# Patient Record
Sex: Female | Born: 1955 | Race: White | Hispanic: No | Marital: Married | Smoking: Never smoker
Health system: Southern US, Community
[De-identification: ages and names within clinical notes are randomized; demographics above are authoritative.]

## PROBLEM LIST (undated history)

## (undated) DIAGNOSIS — K529 Noninfective gastroenteritis and colitis, unspecified: Secondary | ICD-10-CM

## (undated) DIAGNOSIS — J0100 Acute maxillary sinusitis, unspecified: Secondary | ICD-10-CM

## (undated) DIAGNOSIS — K219 Gastro-esophageal reflux disease without esophagitis: Secondary | ICD-10-CM

## (undated) DIAGNOSIS — K648 Other hemorrhoids: Secondary | ICD-10-CM

## (undated) DIAGNOSIS — G4733 Obstructive sleep apnea (adult) (pediatric): Secondary | ICD-10-CM

## (undated) DIAGNOSIS — K5900 Constipation, unspecified: Secondary | ICD-10-CM

## (undated) DIAGNOSIS — K5909 Other constipation: Secondary | ICD-10-CM

## (undated) DIAGNOSIS — K649 Unspecified hemorrhoids: Secondary | ICD-10-CM

## (undated) HISTORY — PX: TONSILLECTOMY: SUR1361

## (undated) HISTORY — DX: Constipation, unspecified: K59.00

## (undated) HISTORY — DX: Other constipation: K59.09

## (undated) HISTORY — DX: Acute maxillary sinusitis, unspecified: J01.00

## (undated) HISTORY — DX: Unspecified hemorrhoids: K64.9

## (undated) HISTORY — DX: Obstructive sleep apnea (adult) (pediatric): G47.33

## (undated) HISTORY — DX: Noninfective gastroenteritis and colitis, unspecified: K52.9

## (undated) HISTORY — DX: Other hemorrhoids: K64.8

---
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
## 1984-07-22 HISTORY — PX: OVARIAN CYST REMOVAL: SHX89

## 1991-07-23 HISTORY — PX: ABDOMINAL HYSTERECTOMY: SHX81

## 1991-07-23 HISTORY — PX: APPENDECTOMY: SHX54

## 2008-12-29 ENCOUNTER — Ambulatory Visit: Payer: Self-pay | Admitting: Family Medicine

## 2008-12-29 DIAGNOSIS — K5900 Constipation, unspecified: Secondary | ICD-10-CM | POA: Insufficient documentation

## 2008-12-29 DIAGNOSIS — M461 Sacroiliitis, not elsewhere classified: Secondary | ICD-10-CM | POA: Insufficient documentation

## 2009-01-20 DIAGNOSIS — K921 Melena: Secondary | ICD-10-CM | POA: Insufficient documentation

## 2009-01-20 DIAGNOSIS — K5900 Constipation, unspecified: Secondary | ICD-10-CM

## 2009-01-20 HISTORY — DX: Constipation, unspecified: K59.00

## 2009-02-20 ENCOUNTER — Ambulatory Visit: Payer: Self-pay | Admitting: Gastroenterology

## 2009-03-14 DIAGNOSIS — K648 Other hemorrhoids: Secondary | ICD-10-CM

## 2009-03-14 HISTORY — DX: Other hemorrhoids: K64.8

## 2009-03-28 ENCOUNTER — Ambulatory Visit: Payer: Self-pay | Admitting: Surgery

## 2009-03-31 ENCOUNTER — Ambulatory Visit: Payer: Self-pay | Admitting: Surgery

## 2009-03-31 HISTORY — PX: HEMORROIDECTOMY: SUR656

## 2009-04-20 DIAGNOSIS — H8309 Labyrinthitis, unspecified ear: Secondary | ICD-10-CM | POA: Insufficient documentation

## 2009-09-06 ENCOUNTER — Ambulatory Visit: Payer: Self-pay | Admitting: Family Medicine

## 2010-04-10 ENCOUNTER — Ambulatory Visit: Payer: Self-pay | Admitting: Surgery

## 2012-08-24 ENCOUNTER — Ambulatory Visit: Payer: Self-pay | Admitting: Family Medicine

## 2013-11-15 LAB — BASIC METABOLIC PANEL
BUN: 12 mg/dL (ref 4–21)
Creatinine: 0.7 mg/dL (ref 0.5–1.1)
GLUCOSE: 90 mg/dL
Potassium: 4.6 mmol/L (ref 3.4–5.3)
Sodium: 143 mmol/L (ref 137–147)

## 2013-11-15 LAB — TSH: TSH: 2.49 u[IU]/mL (ref 0.41–5.90)

## 2013-11-15 LAB — HEPATIC FUNCTION PANEL
ALT: 11 U/L (ref 7–35)
AST: 14 U/L (ref 13–35)
Alkaline Phosphatase: 92 U/L (ref 25–125)
Bilirubin, Total: 0.5 mg/dL

## 2013-11-15 LAB — CBC AND DIFFERENTIAL
HEMATOCRIT: 43 % (ref 36–46)
HEMOGLOBIN: 14.5 g/dL (ref 12.0–16.0)
NEUTROS ABS: 6 /uL
PLATELETS: 245 10*3/uL (ref 150–399)
WBC: 10.7 10*3/mL

## 2013-11-15 LAB — LIPID PANEL
CHOLESTEROL: 220 mg/dL — AB (ref 0–200)
HDL: 74 mg/dL — AB (ref 35–70)
LDL CALC: 129 mg/dL
Triglycerides: 85 mg/dL (ref 40–160)

## 2014-08-23 ENCOUNTER — Ambulatory Visit: Payer: Self-pay | Admitting: Family Medicine

## 2015-03-03 ENCOUNTER — Encounter: Payer: Self-pay | Admitting: *Deleted

## 2015-03-06 ENCOUNTER — Ambulatory Visit: Payer: BLUE CROSS/BLUE SHIELD | Admitting: Anesthesiology

## 2015-03-06 ENCOUNTER — Ambulatory Visit
Admission: RE | Admit: 2015-03-06 | Discharge: 2015-03-06 | Disposition: A | Discharge status: Home / Self Care | Payer: BLUE CROSS/BLUE SHIELD | Source: Ambulatory Visit | Attending: Gastroenterology | Admitting: Gastroenterology

## 2015-03-06 ENCOUNTER — Encounter: Payer: Self-pay | Admitting: *Deleted

## 2015-03-06 ENCOUNTER — Encounter
Admission: RE | Disposition: A | Discharge status: Home / Self Care | Payer: Self-pay | Source: Ambulatory Visit | Attending: Gastroenterology

## 2015-03-06 DIAGNOSIS — K5909 Other constipation: Secondary | ICD-10-CM | POA: Diagnosis present

## 2015-03-06 DIAGNOSIS — D120 Benign neoplasm of cecum: Secondary | ICD-10-CM | POA: Diagnosis not present

## 2015-03-06 DIAGNOSIS — K921 Melena: Secondary | ICD-10-CM | POA: Diagnosis present

## 2015-03-06 DIAGNOSIS — K5900 Constipation, unspecified: Secondary | ICD-10-CM | POA: Insufficient documentation

## 2015-03-06 DIAGNOSIS — Z79899 Other long term (current) drug therapy: Secondary | ICD-10-CM | POA: Insufficient documentation

## 2015-03-06 DIAGNOSIS — Z90710 Acquired absence of both cervix and uterus: Secondary | ICD-10-CM | POA: Insufficient documentation

## 2015-03-06 DIAGNOSIS — K529 Noninfective gastroenteritis and colitis, unspecified: Secondary | ICD-10-CM | POA: Diagnosis not present

## 2015-03-06 DIAGNOSIS — Z9889 Other specified postprocedural states: Secondary | ICD-10-CM | POA: Diagnosis not present

## 2015-03-06 DIAGNOSIS — K644 Residual hemorrhoidal skin tags: Secondary | ICD-10-CM | POA: Diagnosis not present

## 2015-03-06 DIAGNOSIS — K219 Gastro-esophageal reflux disease without esophagitis: Secondary | ICD-10-CM | POA: Diagnosis not present

## 2015-03-06 DIAGNOSIS — Z8371 Family history of colonic polyps: Secondary | ICD-10-CM | POA: Diagnosis present

## 2015-03-06 DIAGNOSIS — Z9049 Acquired absence of other specified parts of digestive tract: Secondary | ICD-10-CM | POA: Insufficient documentation

## 2015-03-06 DIAGNOSIS — K635 Polyp of colon: Secondary | ICD-10-CM | POA: Insufficient documentation

## 2015-03-06 DIAGNOSIS — K625 Hemorrhage of anus and rectum: Secondary | ICD-10-CM | POA: Diagnosis not present

## 2015-03-06 HISTORY — DX: Gastro-esophageal reflux disease without esophagitis: K21.9

## 2015-03-06 HISTORY — PX: COLONOSCOPY WITH PROPOFOL: SHX5780

## 2015-03-06 SURGERY — COLONOSCOPY WITH PROPOFOL
Anesthesia: Monitor Anesthesia Care

## 2015-03-06 MED ORDER — SODIUM CHLORIDE 0.9 % IV SOLN: INTRAVENOUS | Status: DC

## 2015-03-06 MED ORDER — SODIUM CHLORIDE 0.9 % IV SOLN
INTRAVENOUS | Status: DC
  Administered 2015-03-06: 08:00:00 via INTRAVENOUS

## 2015-03-06 MED ORDER — PROPOFOL 10 MG/ML IV BOLUS
INTRAVENOUS | Status: DC | PRN
  Administered 2015-03-06: 50 mg via INTRAVENOUS
  Administered 2015-03-06 (×2): 70 mg via INTRAVENOUS
  Administered 2015-03-06 (×2): 50 mg via INTRAVENOUS
  Administered 2015-03-06: 60 mg via INTRAVENOUS
  Administered 2015-03-06: 50 mg via INTRAVENOUS

## 2015-03-06 NOTE — Anesthesia Preprocedure Evaluation (Signed)
Anesthesia Evaluation  Patient identified by MRN, date of birth, ID band Patient awake    History of Anesthesia Complications Negative for: history of anesthetic complications  Airway Mallampati: II       Dental no notable dental hx.    Pulmonary neg pulmonary ROS,    Pulmonary exam normal       Cardiovascular negative cardio ROS Normal cardiovascular exam    Neuro/Psych    GI/Hepatic Neg liver ROS, GERD-  ,  Endo/Other  negative endocrine ROS  Renal/GU negative Renal ROS     Musculoskeletal negative musculoskeletal ROS (+)   Abdominal Normal abdominal exam  (+)   Peds  Hematology negative hematology ROS (+)   Anesthesia Other Findings   Reproductive/Obstetrics negative OB ROS                             Anesthesia Physical Anesthesia Plan  ASA: II  Anesthesia Plan: MAC   Post-op Pain Management:    Induction: Intravenous  Airway Management Planned: Nasal Cannula  Additional Equipment:   Intra-op Plan:   Post-operative Plan:   Informed Consent: I have reviewed the patients History and Physical, chart, labs and discussed the procedure including the risks, benefits and alternatives for the proposed anesthesia with the patient or authorized representative who has indicated his/her understanding and acceptance.     Plan Discussed with: CRNA  Anesthesia Plan Comments:         Anesthesia Quick Evaluation

## 2015-03-06 NOTE — Transfer of Care (Signed)
Immediate Anesthesia Transfer of Care Note  Patient: Melinda Jenkins  Procedure(s) Performed: Procedure(s): COLONOSCOPY WITH PROPOFOL (N/A)  Patient Location: PACU  Anesthesia Type:General  Level of Consciousness: sedated  Airway & Oxygen Therapy: Patient Spontanous Breathing and Patient connected to nasal cannula oxygen  Post-op Assessment: Report given to RN  Post vital signs: Reviewed and stable  Last Vitals:  Filed Vitals:   03/06/15 0756  BP: 145/79  Pulse: 86  Temp: 36.6 C  Resp: 18    Complications: No apparent anesthesia complications

## 2015-03-06 NOTE — Op Note (Signed)
[facility] Gastroenterology Patient Name: Melinda Jenkins Procedure Date: 03/06/2015 8:05 AM MRN: [id number] Account #: [id number] Date of Birth: 11/15/1955 Admit Type: Outpatient Age: 59 Room: [facility] ENDO ROOM 4 Gender: Female Note Status: Finalized Procedure:         Colonoscopy Indications:       Rectal bleeding, Family history of colonic polyps in a                     first-degree relative, Constipation Providers:         Lupita Dawn. Candace Cruise, MD Referring MD:      Janine Ores. Rosanna Randy, MD (Referring MD) Medicines:         Monitored Anesthesia Care Complications:     No immediate complications. Procedure:         Pre-Anesthesia Assessment:                    - Prior to the procedure, a History and Physical was                     performed, and patient medications, allergies and                     sensitivities were reviewed. The patient's tolerance of                     previous anesthesia was reviewed.                    - The risks and benefits of the procedure and the sedation                     options and risks were discussed with the patient. All                     questions were answered and informed consent was obtained.                    - After reviewing the risks and benefits, the patient was                     deemed in satisfactory condition to undergo the procedure.                    After obtaining informed consent, the colonoscope was                     passed under direct vision. Throughout the procedure, the                     patient's blood pressure, pulse, and oxygen saturations                     were monitored continuously. The Colonoscope was                     introduced through the anus and advanced to the the cecum,                     identified by appendiceal orifice and ileocecal valve. The                     colonoscopy was performed without difficulty. The patient  tolerated the procedure well. The quality of  the bowel                     preparation was fair. Findings:      A diminutive polyp was found in the cecum. The polyp was sessile. The       polyp was removed with a jumbo cold forceps. Resection and retrieval       were complete.      A small polyp was found in the sigmoid colon. The polyp was sessile. The       polyp was removed with a cold snare. Resection and retrieval were       complete.      Localized mild inflammation characterized by erythema and friability was       found in the sigmoid colon. Biopsies were taken with a cold forceps for       histology.      The exam was otherwise without abnormality.      The perianal exam findings include non-thrombosed external hemorrhoids. Impression:        - One diminutive polyp in the cecum. Resected and                     retrieved.                    - One small polyp in the sigmoid colon. Resected and                     retrieved.                    - Localized mild inflammation was found in the sigmoid                     colon secondary to colitis. Biopsied.                    - The examination was otherwise normal.                    - Non-thrombosed external hemorrhoids found on perianal                     exam. Recommendation:    - Discharge patient to home.                    - Await pathology results.                    - Repeat colonoscopy in 5 years for surveillance based on                     pathology results.                    - Continue present medications.                    - The findings and recommendations were discussed with the                     patient. Procedure Code(s): --- Professional ---                    [phone], Colonoscopy, flexible; with removal of tumor(s),  polyp(s), or other lesion(s) by snare technique                    45380, 59, Colonoscopy, flexible; with biopsy, single or                     multiple Diagnosis Code(s): --- Professional ---                     D12.0, Benign neoplasm of cecum                    D12.5, Benign neoplasm of sigmoid colon                    K52.9, Noninfective gastroenteritis and colitis,                     unspecified                    K64.4, Residual hemorrhoidal skin tags                    K62.5, Hemorrhage of anus and rectum                    Z83.71, Family history of colonic polyps                    K59.00, Constipation, unspecified CPT copyright 2014 American Medical Association. All rights reserved. The codes documented in this report are preliminary and upon coder review may  be revised to meet current compliance requirements. Hulen Luster, MD 03/06/2015 8:45:46 AM This report has been signed electronically. Number of Addenda: 0 Note Initiated On: 03/06/2015 8:05 AM Scope Withdrawal Time: 0 hours 13 minutes 48 seconds  Total Procedure Duration: 0 hours 21 minutes 18 seconds       [facility]

## 2015-03-06 NOTE — H&P (Signed)
    Primary Care Physician:  Wilhemena Durie, MD Primary Gastroenterologist:  Dr. Candace Cruise  Pre-Procedure History & Physical: HPI:  [name] is a 59 y.o. female is here for an colonoscopy.   Past Medical History  Diagnosis Date  . GERD (gastroesophageal reflux disease)     Past Surgical History  Procedure Laterality Date  . Abdominal hysterectomy    . Ovarian cyst removal    . Tonsillectomy    . Appendectomy    . Breast surgery      Prior to Admission medications   Medication Sig Start Date End Date Taking? Authorizing Karlita Lichtman  conjugated estrogens (PREMARIN) vaginal cream Place 1 Applicatorful vaginally as needed.   Yes Historical Lyan Moyano, MD  glycerin adult (GLYCERIN ADULT) 2 G SUPP Place 1 suppository rectally as needed for moderate constipation.   Yes Historical Tage Feggins, MD  omeprazole (PRILOSEC) 40 MG capsule Take 40 mg by mouth daily.   Yes Historical Miriam Liles, MD  oxybutynin (DITROPAN XL) 15 MG 24 hr tablet Take 15 mg by mouth at bedtime.   Yes Historical Tiffny Gemmer, MD    Allergies as of 01/19/2015  . (Not on File)    History reviewed. No pertinent family history.  Social History   Social History  . Marital Status: Married    Spouse Name: N/A  . Number of Children: N/A  . Years of Education: N/A   Occupational History  . Not on file.   Social History Main Topics  . Smoking status: Never Smoker   . Smokeless tobacco: Not on file  . Alcohol Use: Not on file  . Drug Use: Not on file  . Sexual Activity: Not on file   Other Topics Concern  . Not on file   Social History Narrative    Review of Systems: See HPI, otherwise negative ROS  Physical Exam: There were no vitals taken for this visit. General:   Alert,  pleasant and cooperative in NAD Head:  Normocephalic and atraumatic. Neck:  Supple; no masses or thyromegaly. Lungs:  Clear throughout to auscultation.    Heart:  Regular rate and rhythm. Abdomen:  Soft, nontender and nondistended.  Normal bowel sounds, without guarding, and without rebound.   Neurologic:  Alert and  oriented x4;  grossly normal neurologically.  Impression/Plan: Melinda Jenkins is here for an colonoscopy to be performed for constipation, family hx of colon polyps, and blood in stool.  Risks, benefits, limitations, and alternatives regarding  colonoscopy have been reviewed with the patient.  Questions have been answered.  All parties agreeable.   OH, Lupita Dawn, MD  03/06/2015, 7:58 AM

## 2015-03-06 NOTE — Anesthesia Postprocedure Evaluation (Signed)
  Anesthesia Post-op Note  Patient: Melinda Jenkins  Procedure(s) Performed: Procedure(s): COLONOSCOPY WITH PROPOFOL (N/A)  Anesthesia type:MAC  Patient location: PACU  Post pain: Pain level controlled  Post assessment: Post-op Vital signs reviewed, Patient's Cardiovascular Status Stable, Respiratory Function Stable, Patent Airway and No signs of Nausea or vomiting  Post vital signs: Reviewed and stable  Last Vitals:  Filed Vitals:   03/06/15 0900  BP: 93/60  Pulse: 75  Temp:   Resp: 15    Level of consciousness: awake, alert  and patient cooperative  Complications: No apparent anesthesia complications

## 2015-03-07 ENCOUNTER — Encounter: Payer: Self-pay | Admitting: Gastroenterology

## 2015-03-07 LAB — SURGICAL PATHOLOGY

## 2015-03-08 ENCOUNTER — Other Ambulatory Visit: Payer: Self-pay | Admitting: Urology

## 2015-03-21 ENCOUNTER — Encounter (INDEPENDENT_AMBULATORY_CARE_PROVIDER_SITE_OTHER): Payer: Self-pay

## 2015-03-21 ENCOUNTER — Encounter: Payer: Self-pay | Admitting: Surgery

## 2015-03-21 ENCOUNTER — Ambulatory Visit (INDEPENDENT_AMBULATORY_CARE_PROVIDER_SITE_OTHER): Payer: BLUE CROSS/BLUE SHIELD | Admitting: Surgery

## 2015-03-21 VITALS — BP 124/79 | HR 72 | Temp 97.5°F | Ht 60.0 in | Wt 136.0 lb

## 2015-03-21 DIAGNOSIS — K641 Second degree hemorrhoids: Secondary | ICD-10-CM

## 2015-03-21 MED ORDER — HYDROCORTISONE ACETATE 25 MG RE SUPP: 25.0000 mg | Freq: Two times a day (BID) | RECTAL | Status: DC

## 2015-03-21 NOTE — Patient Instructions (Signed)
We have sent medication to your pharmacy to help your hemorrhoids shrink and this should help minimize bleeding as well.  If you are no better in 1 month's time, please call our office and we will get you in to see a surgeon.

## 2015-03-21 NOTE — Progress Notes (Signed)
Surgical Consultation  03/21/2015  Melinda Jenkins is an 59 y.o. female.   Chief Complaint  Patient presents with  . Hemorrhoids    Found on Colonoscopy by Dr. Candace Cruise (03/06/15)     HPI: She was recently seen by gastroenterology and underwent a colonoscopy. She was noted to have some large nonthrombosed external hemorrhoids. She was referred to surgery for further evaluation. She has had some recent bleeding and intermittent discomfort while she moves her bowels. There was no other significant findings on the colonoscopy to explain her bleeding.  2010 showed underwent a stapled hemorrhoidectomy with good success. She's not had a second problem does not take any medications for her hemorrhoids recently. She not had any recent rectal trauma. She does have fairly normal bowel movements but is taking a cathartic at the present time.  Past Medical History  Diagnosis Date  . GERD (gastroesophageal reflux disease)   . Hemorrhoids     Found on Colonoscopy (03/06/15)    Past Surgical History  Procedure Laterality Date  . Ovarian cyst removal  1986  . Tonsillectomy  as child  . Colonoscopy with propofol N/A 03/06/2015    Procedure: COLONOSCOPY WITH PROPOFOL;  Surgeon: Hulen Luster, MD;  Location: [facility] ENDOSCOPY;  Service: Gastroenterology;  Laterality: N/A;  . Breast surgery Left 1972    Cyst Removal- Benign  . Abdominal hysterectomy  1993    Partial- Dr. Randon Goldsmith  . Appendectomy  1993    Dr. Randon Goldsmith  . Hemorroidectomy  03/31/2009    Dr. Pat Patrick    Family History  Problem Relation Age of Onset  . Parkinson's disease Father   . Cancer Maternal Grandmother     Mouth  . Heart disease Paternal Grandmother     Cardiomegaly  . Parkinson's disease Paternal Grandfather     Social History:  reports that she has never smoked. She has never used smokeless tobacco. She reports that she does not drink alcohol or use illicit drugs.  Allergies:  Allergies  Allergen Reactions  . Codeine Itching     Medications reviewed.     Review of Systems  Constitutional: Negative.   HENT: Negative.   Eyes: Negative.   Respiratory: Negative.   Cardiovascular: Negative.   Gastrointestinal: Positive for diarrhea and blood in stool.  Genitourinary: Negative.   Musculoskeletal: Negative.   Skin: Negative.   Neurological: Negative.   Psychiatric/Behavioral: Negative.        BP 124/79 mmHg  Pulse 72  Temp(Src) 97.5 F (36.4 C) (Oral)  Ht 5' (1.524 m)  Wt 136 lb (61.689 kg)  BMI 26.56 kg/m2  Physical Exam  Constitutional: She is oriented to person, place, and time and well-developed, well-nourished, and in no distress. No distress.  HENT:  Head: Normocephalic and atraumatic.  Eyes: Conjunctivae are normal. Pupils are equal, round, and reactive to light.  Neck: Normal range of motion. Neck supple.  Cardiovascular: Regular rhythm and normal heart sounds.   Abdominal: Soft. Bowel sounds are normal.  Genitourinary:  She has no significant external hemorrhoids. She does have some external skin tags. Digital examination does not reveal any significant thrombosed internal hemorrhoids and she has no masses noted. Anoscopy was not performed.  Musculoskeletal: Normal range of motion. She exhibits no tenderness.  Neurological: She is alert and oriented to person, place, and time.  Skin: Skin is warm and dry.  Psychiatric: Affect and judgment normal.      No results found for this or any previous visit (from the past  48 hour(s)). No results found.  Assessment/Plan: 1. Second degree hemorrhoids We will treat her symptomatically and see how she does using steroid-based suppositories. I do not see any indication for any surgical intervention at the present time. She is in agreement. We will follow her up as necessary.   Dia Crawford III dermatitis

## 2015-04-23 ENCOUNTER — Other Ambulatory Visit: Payer: Self-pay | Admitting: Urology

## 2015-05-03 ENCOUNTER — Ambulatory Visit (INDEPENDENT_AMBULATORY_CARE_PROVIDER_SITE_OTHER): Payer: BLUE CROSS/BLUE SHIELD | Admitting: Urology

## 2015-05-03 ENCOUNTER — Ambulatory Visit (INDEPENDENT_AMBULATORY_CARE_PROVIDER_SITE_OTHER): Payer: BLUE CROSS/BLUE SHIELD

## 2015-05-03 ENCOUNTER — Encounter: Payer: Self-pay | Admitting: Urology

## 2015-05-03 ENCOUNTER — Telehealth: Payer: Self-pay

## 2015-05-03 VITALS — BP 120/80 | HR 74 | Ht 60.0 in | Wt 131.8 lb

## 2015-05-03 VITALS — BP 120/80 | HR 74

## 2015-05-03 DIAGNOSIS — R822 Biliuria: Secondary | ICD-10-CM | POA: Insufficient documentation

## 2015-05-03 DIAGNOSIS — IMO0001 Reserved for inherently not codable concepts without codable children: Secondary | ICD-10-CM | POA: Insufficient documentation

## 2015-05-03 DIAGNOSIS — R39198 Other difficulties with micturition: Secondary | ICD-10-CM | POA: Diagnosis not present

## 2015-05-03 DIAGNOSIS — R300 Dysuria: Secondary | ICD-10-CM

## 2015-05-03 DIAGNOSIS — R112 Nausea with vomiting, unspecified: Secondary | ICD-10-CM | POA: Insufficient documentation

## 2015-05-03 DIAGNOSIS — R109 Unspecified abdominal pain: Secondary | ICD-10-CM | POA: Insufficient documentation

## 2015-05-03 DIAGNOSIS — Z8669 Personal history of other diseases of the nervous system and sense organs: Secondary | ICD-10-CM | POA: Insufficient documentation

## 2015-05-03 DIAGNOSIS — E7800 Pure hypercholesterolemia, unspecified: Secondary | ICD-10-CM | POA: Insufficient documentation

## 2015-05-03 DIAGNOSIS — Z86718 Personal history of other venous thrombosis and embolism: Secondary | ICD-10-CM | POA: Insufficient documentation

## 2015-05-03 DIAGNOSIS — K121 Other forms of stomatitis: Secondary | ICD-10-CM | POA: Insufficient documentation

## 2015-05-03 DIAGNOSIS — K5909 Other constipation: Secondary | ICD-10-CM

## 2015-05-03 DIAGNOSIS — J0100 Acute maxillary sinusitis, unspecified: Secondary | ICD-10-CM

## 2015-05-03 DIAGNOSIS — M79609 Pain in unspecified limb: Secondary | ICD-10-CM | POA: Insufficient documentation

## 2015-05-03 DIAGNOSIS — N390 Urinary tract infection, site not specified: Secondary | ICD-10-CM | POA: Insufficient documentation

## 2015-05-03 DIAGNOSIS — R251 Tremor, unspecified: Secondary | ICD-10-CM | POA: Insufficient documentation

## 2015-05-03 DIAGNOSIS — R32 Unspecified urinary incontinence: Secondary | ICD-10-CM | POA: Insufficient documentation

## 2015-05-03 DIAGNOSIS — G4733 Obstructive sleep apnea (adult) (pediatric): Secondary | ICD-10-CM

## 2015-05-03 DIAGNOSIS — R351 Nocturia: Secondary | ICD-10-CM | POA: Insufficient documentation

## 2015-05-03 DIAGNOSIS — K529 Noninfective gastroenteritis and colitis, unspecified: Secondary | ICD-10-CM

## 2015-05-03 DIAGNOSIS — Z0000 Encounter for general adult medical examination without abnormal findings: Secondary | ICD-10-CM | POA: Insufficient documentation

## 2015-05-03 HISTORY — DX: Acute maxillary sinusitis, unspecified: J01.00

## 2015-05-03 HISTORY — DX: Other constipation: K59.09

## 2015-05-03 HISTORY — DX: Obstructive sleep apnea (adult) (pediatric): G47.33

## 2015-05-03 HISTORY — DX: Noninfective gastroenteritis and colitis, unspecified: K52.9

## 2015-05-03 LAB — URINALYSIS, COMPLETE
Bilirubin, UA: NEGATIVE
Glucose, UA: NEGATIVE
Ketones, UA: NEGATIVE
NITRITE UA: NEGATIVE
PH UA: 6.5 (ref 5.0–7.5)
Protein, UA: NEGATIVE
RBC, UA: NEGATIVE
Specific Gravity, UA: 1.01 (ref 1.005–1.030)
UUROB: 0.2 mg/dL (ref 0.2–1.0)

## 2015-05-03 LAB — MICROSCOPIC EXAMINATION
RBC MICROSCOPIC, UA: NONE SEEN /HPF (ref 0–?)
Renal Epithel, UA: NONE SEEN /hpf

## 2015-05-03 LAB — BLADDER SCAN AMB NON-IMAGING
SCAN RESULT: 44
Scan Result: 44

## 2015-05-03 MED ORDER — CIPROFLOXACIN HCL 250 MG PO TABS: 250.0000 mg | ORAL_TABLET | Freq: Two times a day (BID) | ORAL | Status: DC

## 2015-05-03 NOTE — Progress Notes (Signed)
Bladder Scan Patient  void: 44 ml Performed By: Larna Daughters

## 2015-05-03 NOTE — Progress Notes (Unsigned)
05/03/2015 2:31 PM   Melinda Jenkins 1955-07-29 [id number]  Referring provider: Jerrol Banana., MD [address] [place], [place] [address]  Chief Complaint  Patient presents with  . Dysuria    nauseated, straining to urinate, and urine odor x 63mth    HPI: The patient is a 59-year-old woman who presents today with a likely urinary tract infection. He is burning suprapubic discomfort increased frequency. She says it feels like her usual urinary tract infections he gets once or twice a year that generally respond antibiotics. She has not had previous GU surgery. She has not had a hysterectomy or stone. At baseline she has little bit of urge incontinence and gets up 3-4 times a night and has normal urinary frequency. She works in the lab. She has been given oxybutynin in the past for her overactive bladder and some local estrogen cream for urinary tract infections  There is no other modifying factors as he signs or symptoms. There is no aggravating or relieving factors. Presentation is moderate severity in ongoing   PMH: Past Medical History  Diagnosis Date  . GERD (gastroesophageal reflux disease)   . Hemorrhoids     Found on Colonoscopy (03/06/15)  . Internal hemorrhoids without complication 0/04/2724  . Acute antritis 05/03/2015  . Severe obstructive sleep apnea 05/03/2015  . Chronic constipation 05/03/2015  . CN (constipation) 01/20/2009  . 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 Ovarian cyst removal  1986  . Tonsillectomy  as child  . Colonoscopy with propofol N/A 03/06/2015    Procedure: COLONOSCOPY WITH PROPOFOL;  Surgeon: Hulen Luster, MD;  Location: [facility] ENDOSCOPY;  Service: Gastroenterology;  Laterality: N/A;  . Breast surgery Left 1972    Cyst Removal- Benign  . Abdominal hysterectomy  1993    Partial- Dr. Randon Goldsmith  . Appendectomy  1993    Dr. Randon Goldsmith  . Hemorroidectomy  03/31/2009    Dr. Pat Patrick     Home Medications:    Medication List       This list is accurate as of: 05/03/15  2:31 PM.  Always use your most recent med list.               conjugated estrogens vaginal cream  Commonly known as:  PREMARIN  Place 1 Applicatorful vaginally as needed.     hydrocortisone 25 MG suppository  Commonly known as:  ANUSOL-HC  Place 1 suppository (25 mg total) rectally 2 (two) times daily.     LINZESS 290 MCG Caps capsule  Generic drug:  Linaclotide  Take 290 mcg by mouth daily.     oxybutynin 15 MG 24 hr tablet  Commonly known as:  DITROPAN XL  TAKE 1 TABLET BY MOUTH EVERY DAY        Allergies:  Allergies  Allergen Reactions  . Codeine Itching    Family History: Family History  Problem Relation Age of Onset  . Parkinson's disease Father   . Cancer Maternal Grandmother     Mouth  . Heart disease Paternal Grandmother     Cardiomegaly  . Parkinson's disease Paternal Grandfather     Social History:  reports that she has never smoked. She has never used smokeless tobacco. She reports that she does not drink alcohol or use illicit drugs.  ROS: UROLOGY Frequent Urination?: Yes Hard to postpone urination?: Yes Burning/pain with urination?: Yes Get up at night to urinate?: Yes Leakage of urine?: Yes  Urine stream starts and stops?: Yes Trouble starting stream?: Yes Do you have to strain to urinate?: Yes Blood in urine?: No Urinary tract infection?: Yes Sexually transmitted disease?: No Injury to kidneys or bladder?: No Painful intercourse?: No Weak stream?: No Currently pregnant?: No Vaginal bleeding?: No Last menstrual period?: n  Gastrointestinal Nausea?: Yes Vomiting?: No Indigestion/heartburn?: Yes Diarrhea?: Yes Constipation?: Yes  Constitutional Fever: No Night sweats?: No Weight loss?: Yes Fatigue?: Yes  Skin Skin rash/lesions?: No Itching?: No  Eyes Blurred vision?: Yes Double vision?: No  Ears/Nose/Throat Sore throat?:  No Sinus problems?: No  Hematologic/Lymphatic Swollen glands?: No Easy bruising?: No  Cardiovascular Leg swelling?: No Chest pain?: No  Respiratory Cough?: No Shortness of breath?: No  Endocrine Excessive thirst?: Yes  Musculoskeletal Back pain?: No Joint pain?: Yes  Neurological Headaches?: No Dizziness?: Yes  Psychologic Depression?: No Anxiety?: No  Physical Exam: BP 120/80 mmHg  Pulse 74  Ht 5' (1.524 m)  Wt 59.784 kg (131 lb 12.8 oz)  BMI 25.74 kg/m2  Constitutional:  Alert and oriented, No acute distress. HEENT: [place] AT, moist mucus membranes.  Trachea midline, no masses. Cardiovascular: No clubbing, cyanosis, or edema. Respiratory: Normal respiratory effort, no increased work of breathing. GI: Abdomen is soft, nontender, nondistended, no abdominal masses GU: No CVA tenderness.  Skin: No rashes, bruises or suspicious lesions. Lymph: No cervical or inguinal adenopathy. Neurologic: Grossly intact, no focal deficits, moving all 4 extremities. Psychiatric: Normal mood and affect.  Laboratory Data:  Urinalysis No results found for: COLORURINE, APPEARANCEUR, LABSPEC, PHURINE, GLUCOSEU, HGBUR, BILIRUBINUR, KETONESUR, PROTEINUR, UROBILINOGEN, NITRITE, LEUKOCYTESUR  Pertinent Imaging: None  Assessment & Plan:  Ms. Cegielski appears to have a urinary tract infection. Also near for culture.  There are no diagnoses linked to this encounter. Urinary tract infection Nocturia  Return if symptoms worsen or fail to improve.  Reece Packer, MD  [facility] [address], [place], [place] [address] [phone]

## 2015-05-03 NOTE — Telephone Encounter (Signed)
Pt called c/o burning, chills, and feeling "loo loo". Per Ria Comment pt can come in as a nurse visit for a urine u/a and cx but needs to make a f/u appt. Pt voiced understanding.

## 2015-05-05 LAB — CULTURE, URINE COMPREHENSIVE

## 2015-05-12 ENCOUNTER — Other Ambulatory Visit: Payer: Self-pay

## 2015-05-12 DIAGNOSIS — N3281 Overactive bladder: Secondary | ICD-10-CM

## 2015-05-12 MED ORDER — OXYBUTYNIN CHLORIDE ER 15 MG PO TB24: 15.0000 mg | ORAL_TABLET | Freq: Every day | ORAL | Status: DC

## 2015-05-23 ENCOUNTER — Telehealth: Payer: Self-pay | Admitting: Urology

## 2015-05-23 DIAGNOSIS — R300 Dysuria: Secondary | ICD-10-CM

## 2015-05-23 DIAGNOSIS — R39198 Other difficulties with micturition: Secondary | ICD-10-CM

## 2015-05-23 NOTE — Telephone Encounter (Signed)
Okay to refill? 

## 2015-05-23 NOTE — Telephone Encounter (Signed)
Pt called and needs refill on Ciprofloxacin 250 mg before she leaves for a cruise this Friday, 05/26/15.  Please call refill in to [facility] in [place].  Pt's can be reached at [phone].

## 2015-05-24 MED ORDER — CIPROFLOXACIN HCL 250 MG PO TABS
250.0000 mg | ORAL_TABLET | Freq: Two times a day (BID) | ORAL | Status: DC
Start: 2015-05-24 — End: 2016-07-31

## 2015-05-24 NOTE — Telephone Encounter (Signed)
Medication refilled. Pt made aware.

## 2016-01-15 ENCOUNTER — Ambulatory Visit: Payer: BLUE CROSS/BLUE SHIELD | Admitting: Urology

## 2016-05-31 ENCOUNTER — Other Ambulatory Visit: Payer: Self-pay | Admitting: Urology

## 2016-05-31 DIAGNOSIS — N3281 Overactive bladder: Secondary | ICD-10-CM

## 2016-06-04 ENCOUNTER — Telehealth: Payer: Self-pay

## 2016-06-04 NOTE — Telephone Encounter (Signed)
Pt called stating she needed a refill of her oxybutynin. Made pt aware she has not been seen in over a year therefore no medication could be refilled. Pt stated that she is going on a cruise and wont be able to get into the office. Made pt aware an appt could be made for after cruise and enough tabs could be given to last until appt. Pt once again stated she is not sure when if ever she would be able to get into [facility] for visit. Reinforced with pt no medication would be refilled then. Pt huffed and asked to be transferred to the front for an appt. No refills given at this time.

## 2016-06-10 ENCOUNTER — Ambulatory Visit: Payer: BLUE CROSS/BLUE SHIELD | Admitting: Urology

## 2016-06-10 ENCOUNTER — Encounter: Payer: Self-pay | Admitting: Urology

## 2016-06-10 VITALS — BP 121/84 | HR 78 | Ht 60.0 in | Wt 124.4 lb

## 2016-06-10 DIAGNOSIS — R300 Dysuria: Secondary | ICD-10-CM

## 2016-06-10 DIAGNOSIS — N3941 Urge incontinence: Secondary | ICD-10-CM | POA: Diagnosis not present

## 2016-06-10 DIAGNOSIS — N3281 Overactive bladder: Secondary | ICD-10-CM

## 2016-06-10 LAB — BLADDER SCAN AMB NON-IMAGING: Scan Result: 0

## 2016-06-10 MED ORDER — OXYBUTYNIN CHLORIDE ER 15 MG PO TB24: 15.0000 mg | ORAL_TABLET | Freq: Every day | ORAL | 12 refills | Status: DC

## 2016-06-10 NOTE — Progress Notes (Signed)
06/10/2016 11:13 AM   Melinda Jenkins [birth year] [id number]  Referring provider: Jerrol Banana., MD [address] [place], [place] [address]  Chief Complaint  Patient presents with  . Medication Refill    1 year follow up appointment for Dysuria    HPI: Patient is a 60 year old Caucasian female who presents today for a one year follow up for OAB.    Patient states that she has had urinary incontinence for several years.  Patient has incontinence with urgency and stress.  She is not  experiencing incontinent episodes while on oxybutynin XL 15 mg daily.  She is getting up twice nightly.    She is not having associated urinary frequency, urgency, dysuria, nocturia, intermittency, hesitancy, straining to urinate and weak urinary stream.     She does not have a history of urinary tract infections, STI's or injury to the bladder.   She denies dysuria, gross hematuria, suprapubic pain, back pain, abdominal pain or flank pain.   She has not had any recent fevers, chills, nausea or vomiting.   She does not have a history of nephrolithiasis, GU surgery or GU trauma.   She is sexually active.  She has not noted incontinence with sexual intercourse.    She is post menopausal.  She admits to constipation and/or diarrhea.   She is not having pain with bladder filling.    She has not had any recent imaging studies.    She is drinking more of water daily.   She is cutting down on drinking caffeinated beverages daily.  She is not drinking alcoholic beverages daily.    Her risk factors for incontinence are age, smoking, caffeine, vaginal atrophy and pelvic surgery.   She is taking benzo's and OAB medication.    PMH: Past Medical History:  Diagnosis Date  . Acute antritis 05/03/2015  . Chronic constipation 05/03/2015  . CN (constipation) 01/20/2009  . Enterogastritis 05/03/2015  . GERD (gastroesophageal reflux disease)   . Hemorrhoids    Found on Colonoscopy  (03/06/15)  . Internal hemorrhoids without complication [id number]  . Severe obstructive sleep apnea 05/03/2015    Surgical History: Past Surgical History:  Procedure Laterality Date  . ABDOMINAL HYSTERECTOMY  1993   Partial- Dr. Randon Goldsmith  . APPENDECTOMY  1993   Dr. Randon Goldsmith  . BREAST SURGERY Left 1972   Cyst Removal- Benign  . COLONOSCOPY WITH PROPOFOL N/A 03/06/2015   Procedure: COLONOSCOPY WITH PROPOFOL;  Surgeon: Hulen Luster, MD;  Location: [facility] END[facility]COPY;  Service: Gastroenterology;  Laterality: N/A;  . HEMORROIDECTOMY  03/31/2009   Dr. Pat Patrick  . OVARIAN CYST REMOVAL  1986  . TONSILLECTOMY  as child    Home Medications:    Medication List       Accurate as of 06/10/16 11:13 AM. Always use your most recent med list.          ciprofloxacin 250 MG tablet Commonly known as:  CIPRO Take 1 tablet (250 mg total) by mouth 2 (two) times daily.   clonazePAM 0.5 MG tablet Commonly known as:  KLONOPIN Take by mouth.   conjugated estrogens vaginal cream Commonly known as:  PREMARIN Place 1 Applicatorful vaginally as needed.   hydrocortisone 25 MG suppository Commonly known as:  ANUSOL-HC Place 1 suppository (25 mg total) rectally 2 (two) times daily.   LINZESS 290 MCG Caps capsule Generic drug:  linaclotide Take 290 mcg by mouth daily.   oxybutynin 15 MG 24 hr tablet Commonly  known as:  DITROPAN XL Take 1 tablet (15 mg total) by mouth daily.   polyethylene glycol-electrolytes 420 g solution Commonly known as:  NuLYTELY/GoLYTELY Take as directed per colonoscopy prep.       Allergies:  Allergies  Allergen Reactions  . Codeine Itching    Family History: Family History  Problem Relation Age of Onset  . Parkinson's disease Father   . Colon polyps Father   . Stroke Father   . Heart disease Father   . Colon polyps Mother   . Cancer Brother   . Hypertension Brother   . Cancer Maternal Grandmother     Mouth  . Heart disease Paternal Grandmother     Cardiomegaly  .  Parkinson's disease Paternal Grandfather   . Kidney cancer Neg Hx   . Bladder Cancer Neg Hx   . Kidney disease Neg Hx     Social History:  reports that she has never smoked. She has never used smokeless tobacco. She reports that she does not drink alcohol or use drugs.  ROS: UROLOGY Frequent Urination?: No Hard to postpone urination?: No Burning/pain with urination?: No Get up at night to urinate?: No Leakage of urine?: No Urine stream starts and stops?: No Trouble starting stream?: No Do you have to strain to urinate?: No Blood in urine?: No Urinary tract infection?: No Sexually transmitted disease?: No Injury to kidneys or bladder?: No Painful intercourse?: No Weak stream?: No Currently pregnant?: No Vaginal bleeding?: No Last menstrual period?: n  Gastrointestinal Nausea?: No Vomiting?: No Indigestion/heartburn?: No Diarrhea?: No Constipation?: No  Constitutional Fever: No Night sweats?: No Weight loss?: No Fatigue?: No  Skin Skin rash/lesions?: No Itching?: No  Eyes Blurred vision?: No Double vision?: No  Ears/Nose/Throat Sore throat?: No Sinus problems?: No  Hematologic/Lymphatic Swollen glands?: No Easy bruising?: No  Cardiovascular Leg swelling?: No Chest pain?: No  Respiratory Cough?: No Shortness of breath?: No  Endocrine Excessive thirst?: No  Musculoskeletal Back pain?: No Joint pain?: No  Neurological Headaches?: Yes Dizziness?: Yes  Psychologic Depression?: No Anxiety?: Yes  Physical Exam: BP 121/84   Pulse 78   Ht 5' (1.524 m)   Wt 124 lb 6.4 oz (56.4 kg)   BMI 24.30 kg/m   Constitutional: Well nourished. Alert and oriented, No acute distress. HEENT: [place] AT, moist mucus membranes. Trachea midline, no masses. Cardiovascular: No clubbing, cyanosis, or edema. Respiratory: Normal respiratory effort, no increased work of breathing. GI: Abdomen is soft, non tender, non distended, no abdominal masses. Liver and spleen  not palpable.  No hernias appreciated.  Stool sample for occult testing is not indicated.   GU: No CVA tenderness.  No bladder fullness or masses.   Skin: No rashes, bruises or suspicious lesions. Lymph: No cervical or inguinal adenopathy. Neurologic: Grossly intact, no focal deficits, moving all 4 extremities. Psychiatric: Normal mood and affect.  Laboratory Data: Lab Results  Component Value Date   WBC 10.7 11/15/2013   HGB 14.5 11/15/2013   HCT 43 11/15/2013   PLT 245 11/15/2013    Lab Results  Component Value Date   CREATININE 0.7 11/15/2013    Lab Results  Component Value Date   TSH 2.49 11/15/2013       Component Value Date/Time   CHOL 220 (A) 11/15/2013   HDL 74 (A) 11/15/2013   LDLCALC 129 11/15/2013    Lab Results  Component Value Date   AST 14 11/15/2013   Lab Results  Component Value Date   ALT 11 11/15/2013  Pertinent Imaging: Results for DESA, MIDDAUGH (MRN [id number]) as of 06/10/2016 11:07  Ref. Range 06/10/2016 10:38  Scan Result Unknown 0    Assessment & Plan:    1. Urge incontinence  - continue the Ditropan XL 15 mg daily  - RTC in one year OAB questionnaire and PVR  - BLADDER SCAN AMB NON-IMAGING  2. OAB  - see above   Return in about 1 year (around 06/10/2017) for exam, OAB questionnaire and PVR.  These notes generated with voice recognition software. I apologize for typographical errors.  Zara Council, [facility] [address], [place], [place] [address] [phone]

## 2016-07-31 ENCOUNTER — Encounter: Payer: Self-pay | Admitting: Family Medicine

## 2016-07-31 ENCOUNTER — Ambulatory Visit (INDEPENDENT_AMBULATORY_CARE_PROVIDER_SITE_OTHER): Payer: BLUE CROSS/BLUE SHIELD | Admitting: Family Medicine

## 2016-07-31 VITALS — BP 122/76 | HR 102 | Temp 99.4°F | Resp 16 | Wt 129.2 lb

## 2016-07-31 DIAGNOSIS — M545 Low back pain, unspecified: Secondary | ICD-10-CM

## 2016-07-31 DIAGNOSIS — B349 Viral infection, unspecified: Secondary | ICD-10-CM | POA: Diagnosis not present

## 2016-07-31 LAB — POCT URINALYSIS DIPSTICK
GLUCOSE UA: NEGATIVE
Leukocytes, UA: NEGATIVE
Nitrite, UA: NEGATIVE
PROTEIN UA: NEGATIVE
RBC UA: NEGATIVE
SPEC GRAV UA: 1.015
UROBILINOGEN UA: 0.2
pH, UA: 5

## 2016-07-31 MED ORDER — HYDROCODONE-HOMATROPINE 5-1.5 MG/5ML PO SYRP: ORAL_SOLUTION | ORAL | 0 refills | Status: DC

## 2016-07-31 NOTE — Progress Notes (Signed)
Subjective:     Patient ID: Melinda Jenkins, female   DOB: 1956-05-19, 61 y.o.   MRN: [id number]  HPI   Review of Systems     Objective:   Physical Exam     Assessment:        Plan:

## 2016-07-31 NOTE — Patient Instructions (Signed)
Discussed use of Mucinex D for congestion and Delsym for cough. Let me know if not improving as you get through the weekend.

## 2016-07-31 NOTE — Progress Notes (Signed)
Subjective:     Patient ID: Melinda Jenkins, female   DOB: 1956-05-11, 61 y.o.   MRN: [id number]  HPI  Chief Complaint  Patient presents with  . Fever    Patient comes in office today with complaints of fever, body ache and cough since 07/28/16. Patient reports fever has been high of 100 , coughing thick yellow phelgm and has had shortness of breath and headache. Patient reports that she has been taking otc medication for cold and cough.   Reports she has has the flu vaccine. States due to back pain wishes her urine checked as well.   Review of Systems     Objective:   Physical Exam  Constitutional: She appears well-developed and well-nourished. No distress.  Ears: T.M's intact without inflammation Throat: tonsils absent Neck: no cervical adenopathy Lungs: clear     Assessment:    1. Acute viral syndrome - HYDROcodone-homatropine (HYCODAN) 5-1.5 MG/5ML syrup; 5 ml 4-6 hours as needed for cough  Dispense: 240 mL; Refill: 0  2. Low back pain without sciatica, unspecified back pain laterality, unspecified chronicity - POCT urinalysis dipstick    Plan:    Discussed use of Mucinex D and Delsym. Work excuse for  1/10-1/15/18

## 2016-08-02 ENCOUNTER — Telehealth: Payer: Self-pay | Admitting: Family Medicine

## 2016-08-02 NOTE — Telephone Encounter (Signed)
Please advise 

## 2016-08-02 NOTE — Telephone Encounter (Signed)
Pt was in Wednesday and seen Dr. Caryn Section for Flu.  Pt called back to say she has clear mucus coming from her right eye.  Eye is swollen.  Hurts bad  Not reallly feeling much better.  She thinks she needs an antibiotic  She uses [facility].  Call back pt [phone]  Thank sTeri

## 2016-08-05 NOTE — Telephone Encounter (Signed)
Pt saw Carmon Ginsberg, this message was sent to Dr Marlan Palau box-aa

## 2016-08-06 NOTE — Telephone Encounter (Signed)
Left message to call back on 1/15 in the AM with no response. Called again at the end of the day with no answer.

## 2017-07-04 ENCOUNTER — Telehealth: Payer: Self-pay | Admitting: Urology

## 2017-07-04 NOTE — Telephone Encounter (Signed)
Pt needs refill of Oxybutnin.  [facility] [place].

## 2017-07-06 NOTE — Progress Notes (Signed)
07/08/2017 4:26 PM   Melinda Jenkins March 08, 1956 [id number]  Referring provider: Jerrol Banana., MD [address] [place], [place] [address]  Chief Complaint  Patient presents with  . Urge Incontinence    HPI: Patient is a 62 year old Caucasian female who presents today for a one year follow up for OAB.    Background history Patient states that she has had urinary incontinence for several years.  Patient has incontinence with urgency and stress.  She is not  experiencing incontinent episodes while on oxybutynin XL 15 mg daily.  She is getting up twice nightly.  She is not having associated urinary frequency, urgency, dysuria, nocturia, intermittency, hesitancy, straining to urinate and weak urinary stream.   She does not have a history of urinary tract infections, STI's or injury to the bladder.  She denies dysuria, gross hematuria, suprapubic pain, back pain, abdominal pain or flank pain.   She has not had any recent fevers, chills, nausea or vomiting.  She does not have a history of nephrolithiasis, GU surgery or GU trauma.  She is sexually active.  She has not noted incontinence with sexual intercourse.   She is post menopausal.  She admits to constipation and/or diarrhea.  She is not having pain with bladder filling.  She has not had any recent imaging studies.  She is drinking more of water daily.   She is cutting down on drinking caffeinated beverages daily.  She is not drinking alcoholic beverages daily.  Her risk factors for incontinence are age, smoking, caffeine, vaginal atrophy and pelvic surgery.   She is taking benzo's and OAB medication.    Today, she is experiencing urgency x 0-3, frequency x 4-7, is restricting fluids to avoid visits to the restroom, is engaging in toilet mapping, incontinence x 0-3 and nocturia x 0-3.   Her PVR is 0 mL.  She is not having dysuria, gross hematuria or suprapubic pain.  She denies fevers, chills, nausea or vomiting.  Her UA is  negative.    PMH: Past Medical History:  Diagnosis Date  . Acute antritis 05/03/2015  . Chronic constipation 05/03/2015  . CN (constipation) 01/20/2009  . Enterogastritis 05/03/2015  . GERD (gastroesophageal reflux disease)   . Hemorrhoids    Found on Colonoscopy (03/06/15)  . Internal hemorrhoids without complication 4/33/2951  . Severe obstructive sleep apnea 05/03/2015    Surgical History: Past Surgical History:  Procedure Laterality Date  . ABDOMINAL HYSTERECTOMY  1993   Partial- Dr. Randon Goldsmith  . APPENDECTOMY  1993   Dr. Randon Goldsmith  . BREAST SURGERY Left 1972   Cyst Removal- Benign  . COLONOSCOPY WITH PROPOFOL N/A 03/06/2015   Procedure: COLONOSCOPY WITH PROPOFOL;  Surgeon: Hulen Luster, MD;  Location: [facility] ENDOSCOPY;  Service: Gastroenterology;  Laterality: N/A;  . HEMORROIDECTOMY  03/31/2009   Dr. Pat Patrick  . OVARIAN CYST REMOVAL  1986  . TONSILLECTOMY  as child    Home Medications:  Allergies as of 07/08/2017      Reactions   Codeine Itching      Medication List        Accurate as of 07/08/17  4:26 PM. Always use your most recent med list.          LINZESS 290 MCG Caps capsule Generic drug:  linaclotide Take 290 mcg by mouth daily.   oxybutynin 15 MG 24 hr tablet Commonly known as:  DITROPAN XL Take 1 tablet (15 mg total) by mouth daily.  Allergies:  Allergies  Allergen Reactions  . Codeine Itching    Family History: Family History  Problem Relation Age of Onset  . Parkinson's disease Father   . Colon polyps Father   . Stroke Father   . Heart disease Father   . Colon polyps Mother   . Cancer Brother   . Hypertension Brother   . Cancer Maternal Grandmother        Mouth  . Heart disease Paternal Grandmother        Cardiomegaly  . Parkinson's disease Paternal Grandfather   . Kidney cancer Neg Hx   . Bladder Cancer Neg Hx   . Kidney disease Neg Hx     Social History:  reports that  has never smoked. she has never used smokeless tobacco. She  reports that she does not drink alcohol or use drugs.  ROS: UROLOGY Frequent Urination?: No Hard to postpone urination?: No Burning/pain with urination?: No Get up at night to urinate?: Yes Leakage of urine?: No Urine stream starts and stops?: No Trouble starting stream?: No Do you have to strain to urinate?: No Blood in urine?: No Urinary tract infection?: No Sexually transmitted disease?: No Injury to kidneys or bladder?: No Painful intercourse?: No Weak stream?: No Currently pregnant?: No Vaginal bleeding?: No Last menstrual period?: n  Gastrointestinal Nausea?: No Vomiting?: No Indigestion/heartburn?: Yes Diarrhea?: No Constipation?: No  Constitutional Fever: No Night sweats?: No Weight loss?: Yes Fatigue?: No  Skin Skin rash/lesions?: No Itching?: No  Eyes Blurred vision?: Yes Double vision?: No  Ears/Nose/Throat Sore throat?: No Sinus problems?: No  Hematologic/Lymphatic Swollen glands?: No Easy bruising?: No  Cardiovascular Leg swelling?: No Chest pain?: No  Respiratory Cough?: No Shortness of breath?: No  Endocrine Excessive thirst?: No  Musculoskeletal Back pain?: No Joint pain?: No  Neurological Headaches?: No Dizziness?: No  Psychologic Depression?: No Anxiety?: No  Physical Exam: BP 123/83   Pulse 72   Ht 5' (1.524 m)   Wt 127 lb 6.4 oz (57.8 kg)   BMI 24.88 kg/m   Constitutional: Well nourished. Alert and oriented, No acute distress. HEENT: [place] AT, moist mucus membranes. Trachea midline, no masses. Cardiovascular: No clubbing, cyanosis, or edema. Respiratory: Normal respiratory effort, no increased work of breathing. GI: Abdomen is soft, non tender, non distended, no abdominal masses.  GU: No CVA tenderness.  No bladder fullness or masses.   Skin: No rashes, bruises or suspicious lesions. Lymph: No cervical or inguinal adenopathy. Neurologic: Grossly intact, no focal deficits, moving all 4  extremities. Psychiatric: Normal mood and affect.  Laboratory Data: Results for orders placed or performed in visit on 07/08/17  Bladder Scan (Post Void Residual) in office  Result Value Ref Range   Scan Result 0 ml    I have reviewed the labs.  Pertinent Imaging: Results for DAKOTA, STANGL (MRN [id number]) as of 07/08/2017 16:25  Ref. Range 07/08/2017 16:06  Scan Result Unknown 0 ml     Assessment & Plan:    1. Urge incontinence  - continue the Ditropan XL 15 mg daily  - RTC in one year OAB questionnaire and PVR  - BLADDER SCAN AMB NON-IMAGING  2. OAB  - see above   Return in about 1 year (around 07/08/2018) for PVR and OAB questionnaire.  These notes generated with voice recognition software. I apologize for typographical errors.  Zara Council, [facility] [address], [place], [place] [address] [phone]

## 2017-07-08 ENCOUNTER — Other Ambulatory Visit: Payer: Self-pay

## 2017-07-08 ENCOUNTER — Ambulatory Visit: Payer: BLUE CROSS/BLUE SHIELD | Admitting: Urology

## 2017-07-08 ENCOUNTER — Encounter: Payer: Self-pay | Admitting: Urology

## 2017-07-08 VITALS — BP 123/83 | HR 72 | Ht 60.0 in | Wt 127.4 lb

## 2017-07-08 DIAGNOSIS — N3281 Overactive bladder: Secondary | ICD-10-CM | POA: Diagnosis not present

## 2017-07-08 DIAGNOSIS — N3941 Urge incontinence: Secondary | ICD-10-CM

## 2017-07-08 LAB — BLADDER SCAN AMB NON-IMAGING: Scan Result: 0

## 2017-07-08 MED ORDER — OXYBUTYNIN CHLORIDE ER 15 MG PO TB24
15.0000 mg | ORAL_TABLET | Freq: Every day | ORAL | 12 refills | Status: DC
Start: 2017-07-08 — End: 2017-07-08

## 2017-07-08 MED ORDER — OXYBUTYNIN CHLORIDE ER 15 MG PO TB24: 15.0000 mg | ORAL_TABLET | Freq: Every day | ORAL | 3 refills | Status: DC

## 2017-07-09 LAB — URINALYSIS, COMPLETE
Bilirubin, UA: NEGATIVE
Glucose, UA: NEGATIVE
Ketones, UA: NEGATIVE
Leukocytes, UA: NEGATIVE
NITRITE UA: NEGATIVE
PH UA: 5.5 (ref 5.0–7.5)
Protein, UA: NEGATIVE
RBC, UA: NEGATIVE
Specific Gravity, UA: 1.015 (ref 1.005–1.030)
UUROB: 0.2 mg/dL (ref 0.2–1.0)

## 2017-07-09 LAB — MICROSCOPIC EXAMINATION
Epithelial Cells (non renal): NONE SEEN /hpf (ref 0–10)
RBC MICROSCOPIC, UA: NONE SEEN /HPF (ref 0–?)

## 2018-01-14 ENCOUNTER — Ambulatory Visit: Payer: BLUE CROSS/BLUE SHIELD | Admitting: Family Medicine

## 2018-01-15 ENCOUNTER — Ambulatory Visit: Payer: BLUE CROSS/BLUE SHIELD

## 2018-01-15 DIAGNOSIS — N3941 Urge incontinence: Secondary | ICD-10-CM

## 2018-01-15 NOTE — Progress Notes (Signed)
Melinda Jenkins is present in office today for nurse visit. She is complaining of urinary frequency, dysuria, back/flank pain, lower abdominal pain, nausea, and urinary urgency. Pt denies usage of abx in the past 30 days. Pt denies supressive abx. Pt denies urological surgery in the last 30 days. Pt provided urine sample for analysis. Upon examination, urine did not seem suspicious for infection. Will wait for culture on urine.

## 2018-01-16 LAB — URINALYSIS, COMPLETE
BILIRUBIN UA: NEGATIVE
GLUCOSE, UA: NEGATIVE
KETONES UA: NEGATIVE
NITRITE UA: NEGATIVE
Protein, UA: NEGATIVE
RBC UA: NEGATIVE
SPEC GRAV UA: 1.01 (ref 1.005–1.030)
Urobilinogen, Ur: 0.2 mg/dL (ref 0.2–1.0)
pH, UA: 5.5 (ref 5.0–7.5)

## 2018-01-16 LAB — MICROSCOPIC EXAMINATION

## 2018-01-18 LAB — CULTURE, URINE COMPREHENSIVE

## 2018-01-19 ENCOUNTER — Telehealth: Payer: Self-pay

## 2018-01-19 NOTE — Telephone Encounter (Signed)
-----   Message from Nori Riis, PA-C sent at 01/18/2018  5:55 PM EDT ----- Please let Mrs. Blinkley know that her urine culture is negative and to follow up with her PCP.

## 2018-01-19 NOTE — Telephone Encounter (Signed)
Pt informed via answering machine

## 2018-05-08 ENCOUNTER — Encounter: Payer: Self-pay | Admitting: Urology

## 2018-05-08 ENCOUNTER — Ambulatory Visit (INDEPENDENT_AMBULATORY_CARE_PROVIDER_SITE_OTHER): Payer: BLUE CROSS/BLUE SHIELD | Admitting: Urology

## 2018-05-08 VITALS — BP 123/82 | HR 97 | Ht 60.0 in | Wt 125.0 lb

## 2018-05-08 DIAGNOSIS — N3941 Urge incontinence: Secondary | ICD-10-CM | POA: Diagnosis not present

## 2018-05-08 LAB — BLADDER SCAN AMB NON-IMAGING: Scan Result: 11

## 2018-05-08 LAB — MICROSCOPIC EXAMINATION
BACTERIA UA: NONE SEEN
RBC MICROSCOPIC, UA: NONE SEEN /HPF (ref 0–2)

## 2018-05-08 LAB — URINALYSIS, COMPLETE
Bilirubin, UA: NEGATIVE
Glucose, UA: NEGATIVE
Ketones, UA: NEGATIVE
Nitrite, UA: NEGATIVE
PH UA: 6.5 (ref 5.0–7.5)
PROTEIN UA: NEGATIVE
RBC, UA: NEGATIVE
Specific Gravity, UA: 1.01 (ref 1.005–1.030)
Urobilinogen, Ur: 0.2 mg/dL (ref 0.2–1.0)

## 2018-05-08 MED ORDER — FLUCONAZOLE 150 MG PO TABS: 150.0000 mg | ORAL_TABLET | Freq: Once | ORAL | 3 refills | Status: AC

## 2018-05-08 MED ORDER — ESTROGENS, CONJUGATED 0.625 MG/GM VA CREA: TOPICAL_CREAM | VAGINAL | 12 refills | Status: DC

## 2018-05-08 NOTE — Progress Notes (Signed)
05/08/2018 10:28 AM   Laquetta Thompson Caul [birth year] [id number]  Referring provider: Jerrol Banana., MD [address] [place], [place] [address]  Chief Complaint  Patient presents with  . Urinary Incontinence    HPI: Patient is a 62 year old Caucasian female who presents today for symptoms of an UTI.   Background history Patient states that she has had urinary incontinence for several years.  Patient has incontinence with urgency and stress.  She is not  experiencing incontinent episodes while on oxybutynin XL 15 mg daily.  She is getting up twice nightly.  She is not having associated urinary frequency, urgency, dysuria, nocturia, intermittency, hesitancy, straining to urinate and weak urinary stream.   She does not have a history of urinary tract infections, STI's or injury to the bladder.  She denies dysuria, gross hematuria, suprapubic pain, back pain, abdominal pain or flank pain.   She has not had any recent fevers, chills, nausea or vomiting.  She does not have a history of nephrolithiasis, GU surgery or GU trauma.  She is sexually active.  She has not noted incontinence with sexual intercourse.   She is post menopausal.  She admits to constipation and/or diarrhea.  She is not having pain with bladder filling.  She has not had any recent imaging studies.  She is drinking more of water daily.   She is cutting down on drinking caffeinated beverages daily.  She is not drinking alcoholic beverages daily.  Her risk factors for incontinence are age, smoking, caffeine, vaginal atrophy and pelvic surgery.   She is taking benzo's and OAB medication.    She states she was on an antibiotic four weeks for an URI.   Then she started having frequency, dysuria, nocturia, intermittency and straining to urinate two weeks later.  She started on Augmentin one week ago and will continue the Augmentin for another week.   Her PVR is 11 mL.  She is not having gross hematuria or flank  pain/suprapubic pain.  She denies fevers, chills, nausea or vomiting.  Her UA is negative.    She is having a yeasty smell and a light vaginal discharge.    PMH: Past Medical History:  Diagnosis Date  . Acute antritis 05/03/2015  . Chronic constipation 05/03/2015  . CN (constipation) 01/20/2009  . Enterogastritis 05/03/2015  . GERD (gastroesophageal reflux disease)   . Hemorrhoids    Found on Colonoscopy (03/06/15)  . Internal hemorrhoids without complication 2/35/5732  . Severe obstructive sleep apnea 05/03/2015    Surgical History: Past Surgical History:  Procedure Laterality Date  . ABDOMINAL HYSTERECTOMY  1993   Partial- Dr. Randon Goldsmith  . APPENDECTOMY  1993   Dr. Randon Goldsmith  . BREAST SURGERY Left 1972   Cyst Removal- Benign  . COLONOSCOPY WITH PROPOFOL N/A 03/06/2015   Procedure: COLONOSCOPY WITH PROPOFOL;  Surgeon: Hulen Luster, MD;  Location: [facility] ENDOSCOPY;  Service: Gastroenterology;  Laterality: N/A;  . HEMORROIDECTOMY  03/31/2009   Dr. Pat Patrick  . OVARIAN CYST REMOVAL  1986  . TONSILLECTOMY  as child    Home Medications:  Allergies as of 05/08/2018      Reactions   Codeine Itching      Medication List        Accurate as of 05/08/18 10:28 AM. Always use your most recent med list.          amoxicillin 500 MG capsule Commonly known as:  AMOXIL Take 500 mg by mouth daily.  LINZESS 290 MCG Caps capsule Generic drug:  linaclotide Take 290 mcg by mouth daily.   oxybutynin 15 MG 24 hr tablet Commonly known as:  DITROPAN XL Take 1 tablet (15 mg total) by mouth daily.       Allergies:  Allergies  Allergen Reactions  . Codeine Itching    Family History: Family History  Problem Relation Age of Onset  . Parkinson's disease Father   . Colon polyps Father   . Stroke Father   . Heart disease Father   . Colon polyps Mother   . Cancer Brother   . Hypertension Brother   . Cancer Maternal Grandmother        Mouth  . Heart disease Paternal Grandmother         Cardiomegaly  . Parkinson's disease Paternal Grandfather   . Kidney cancer Neg Hx   . Bladder Cancer Neg Hx   . Kidney disease Neg Hx     Social History:  reports that she has never smoked. She has never used smokeless tobacco. She reports that she does not drink alcohol or use drugs.  ROS: UROLOGY Frequent Urination?: Yes Hard to postpone urination?: No Burning/pain with urination?: Yes Get up at night to urinate?: Yes Leakage of urine?: No Urine stream starts and stops?: Yes Trouble starting stream?: No Do you have to strain to urinate?: Yes Blood in urine?: No Urinary tract infection?: Yes Sexually transmitted disease?: No Injury to kidneys or bladder?: No Painful intercourse?: Yes Weak stream?: No Currently pregnant?: No Vaginal bleeding?: No Last menstrual period?: n  Gastrointestinal Nausea?: No Vomiting?: No Indigestion/heartburn?: Yes Diarrhea?: Yes Constipation?: Yes  Constitutional Fever: No Night sweats?: No Weight loss?: Yes Fatigue?: Yes  Skin Skin rash/lesions?: No Itching?: No  Eyes Blurred vision?: No Double vision?: Yes  Ears/Nose/Throat Sore throat?: No  Hematologic/Lymphatic Swollen glands?: No Easy bruising?: No  Cardiovascular Leg swelling?: No Chest pain?: No  Respiratory Cough?: No Shortness of breath?: No  Endocrine Excessive thirst?: No  Musculoskeletal Back pain?: No Joint pain?: No  Neurological Headaches?: No Dizziness?: No  Psychologic Depression?: No Anxiety?: Yes  Physical Exam: BP 123/82 (BP Location: Left Arm, Patient Position: Sitting, Cuff Size: Normal)   Pulse 97   Ht 5' (1.524 m)   Wt 125 lb (56.7 kg)   BMI 24.41 kg/m   Constitutional: Well nourished. Alert and oriented, No acute distress. HEENT: [place] AT, moist mucus membranes. Trachea midline, no masses. Cardiovascular: No clubbing, cyanosis, or edema. Respiratory: Normal respiratory effort, no increased work of breathing. GI: Abdomen is  soft, non tender, non distended, no abdominal masses.  GU: No CVA tenderness.  No bladder fullness or masses.  Atrophic external genitalia, normal pubic hair distribution, satellite lesions present in the introitus.  Small urethral caruncle is noted.  No urethral tenderness and/or tenderness.  Small polypoid lesions below the meatus.  No bladder fullness, tenderness or masses. Pale vagina mucosa, poor estrogen effect, no discharge, no lesions, fair pelvic support, no cystocele or rectocele noted.  Cervix and uterus are surgically absent.  No adnexal/parametria masses or tenderness noted.  Anus and perineum are without rashes or lesions.    Skin: No rashes, bruises or suspicious lesions. Lymph: No cervical or inguinal adenopathy. Neurologic: Grossly intact, no focal deficits, moving all 4 extremities. Psychiatric: Normal mood and affect.  Laboratory Data: Negative urinalysis.  See Epic. I have reviewed the labs.  Pertinent Imaging: Results for orders placed or performed in visit on 05/08/18  Bladder Scan (Post  Void Residual) in office  Result Value Ref Range   Scan Result 11    Assessment & Plan:    1. Vaginal yeast infection Will prescribe Diflucan Instructed to take a probiotic UA sent for culture RTC in one month for recheck  2.  Vaginal atrophy  Patient was given a sample of vaginal estrogen cream (Premarin vaginal cream) and instructed to apply 0.5mg  (pea-sized amount)  just inside the vaginal introitus with a finger-tip on Monday, Wednesday and Friday nights.  I explained to the patient that vaginally administered estrogen, which causes only a slight increase in the blood estrogen levels, have fewer contraindications and adverse systemic effects that oral HT. I have also given prescriptions for Premarin cream.   If she finds medication cost prohibitive, she is instructed to call the office.  We can then call in a compounded vaginal estrogen cream for the patient that may be more  affordable.  She will follow up in one month for an exam.    Return in about 1 month (around 06/08/2018) for exam.  These notes generated with voice recognition software. I apologize for typographical errors.  Zara Council, PA-C  [facility] [address] [place] [employer], [place] [address] [phone]

## 2018-05-14 LAB — CULTURE, URINE COMPREHENSIVE

## 2018-05-15 ENCOUNTER — Telehealth: Payer: Self-pay | Admitting: Family Medicine

## 2018-05-15 NOTE — Telephone Encounter (Signed)
Patient notified

## 2018-05-15 NOTE — Telephone Encounter (Signed)
-----   Message from Nori Riis, PA-C sent at 05/15/2018  7:32 AM EDT ----- Please let Mrs. Reihl know that her urine culture grew out a skin contaminant.  There is no need for antibiotics at this time.

## 2018-07-09 ENCOUNTER — Ambulatory Visit: Payer: BLUE CROSS/BLUE SHIELD | Admitting: Urology

## 2018-07-10 ENCOUNTER — Other Ambulatory Visit: Payer: Self-pay | Admitting: Urology

## 2018-07-10 DIAGNOSIS — N3281 Overactive bladder: Secondary | ICD-10-CM

## 2018-07-12 NOTE — Progress Notes (Signed)
07/13/2018 3:05 PM   Melinda Jenkins 1956/04/28 [id number]  Referring provider: Jerrol Banana., MD [address] [place], [place] [address]  Chief Complaint  Patient presents with  . Over Active Bladder    1year    HPI: Patient is a 62 year old Caucasian female who presents today for follow up after vaginal yeast infection and vaginal atrophy.  Background history Patient states that she has had urinary incontinence for several years.  Patient has incontinence with urgency and stress.  She is not  experiencing incontinent episodes while on oxybutynin XL 15 mg daily.  She is getting up twice nightly.  She is not having associated urinary frequency, urgency, dysuria, nocturia, intermittency, hesitancy, straining to urinate and weak urinary stream.   She does not have a history of urinary tract infections, STI's or injury to the bladder.  She denies dysuria, gross hematuria, suprapubic pain, back pain, abdominal pain or flank pain.   She has not had any recent fevers, chills, nausea or vomiting.  She does not have a history of nephrolithiasis, GU surgery or GU trauma.  She is sexually active.  She has not noted incontinence with sexual intercourse.   She is post menopausal.  She admits to constipation and/or diarrhea.  She is not having pain with bladder filling.  She has not had any recent imaging studies.  She is drinking more of water daily.   She is cutting down on drinking caffeinated beverages daily.  She is not drinking alcoholic beverages daily.  Her risk factors for incontinence are age, smoking, caffeine, vaginal atrophy and pelvic surgery.   She is taking benzo's and OAB medication.    At her visit on 05/08/2018, she states she was on an antibiotic four weeks for an URI.   Then she started having frequency, dysuria, nocturia, intermittency and straining to urinate two weeks later.  She started on Augmentin one week ago and will continue the Augmentin for another  week.   Her PVR is 11 mL.  She is not having gross hematuria or flank pain/suprapubic pain.  She denies fevers, chills, nausea or vomiting.  Her UA is negative.  She is having a yeasty smell and a light vaginal discharge.  She was started on vaginal estrogen cream and Diflucan.    Today, the patient is  experiencing urgency x 0-3, frequency x 4-7, not restricting fluids to avoid visits to the restroom, is engaging in toilet mapping, incontinence x 0-3 and nocturia x 0-3.   Her PVR is 59 mL.  Patient denies any gross hematuria, dysuria or suprapubic/flank pain.  Patient denies any fevers, chills, nausea or vomiting.  Her UA today is positive for 6-10 WBC's.    She is using the vaginal estrogen cream three nights weekly.  She is still having a little "twinge" after she urinates.    PMH: Past Medical History:  Diagnosis Date  . Acute antritis 05/03/2015  . Chronic constipation 05/03/2015  . CN (constipation) 01/20/2009  . Enterogastritis 05/03/2015  . GERD (gastroesophageal reflux disease)   . Hemorrhoids    Found on Colonoscopy (03/06/15)  . Internal hemorrhoids without complication 4/70/9628  . Severe obstructive sleep apnea 05/03/2015    Surgical History: Past Surgical History:  Procedure Laterality Date  . ABDOMINAL HYSTERECTOMY  1993   Partial- Dr. Randon Goldsmith  . APPENDECTOMY  1993   Dr. Randon Goldsmith  . BREAST SURGERY Left 1972   Cyst Removal- Benign  . COLONOSCOPY WITH PROPOFOL N/A  03/06/2015   Procedure: COLONOSCOPY WITH PROPOFOL;  Surgeon: Hulen Luster, MD;  Location: [facility] ENDOSCOPY;  Service: Gastroenterology;  Laterality: N/A;  . HEMORROIDECTOMY  03/31/2009   Dr. Pat Patrick  . OVARIAN CYST REMOVAL  1986  . TONSILLECTOMY  as child    Home Medications:  Allergies as of 07/13/2018      Reactions   Codeine Itching      Medication List       Accurate as of July 13, 2018  3:05 PM. Always use your most recent med list.        amoxicillin 500 MG capsule Commonly known as:  AMOXIL Take 500  mg by mouth daily.   conjugated estrogens vaginal cream Commonly known as:  PREMARIN Apply 0.5mg  (pea-sized amount)  just inside the vaginal introitus with a finger-tip on  Monday, Wednesday and Friday nights.   LINZESS 290 MCG Caps capsule Generic drug:  linaclotide Take 290 mcg by mouth daily.   oxybutynin 15 MG 24 hr tablet Commonly known as:  DITROPAN XL Take 1 tablet (15 mg total) by mouth daily.       Allergies:  Allergies  Allergen Reactions  . Codeine Itching    Family History: Family History  Problem Relation Age of Onset  . Parkinson's disease Father   . Colon polyps Father   . Stroke Father   . Heart disease Father   . Colon polyps Mother   . Cancer Brother   . Hypertension Brother   . Cancer Maternal Grandmother        Mouth  . Heart disease Paternal Grandmother        Cardiomegaly  . Parkinson's disease Paternal Grandfather   . Kidney cancer Neg Hx   . Bladder Cancer Neg Hx   . Kidney disease Neg Hx     Social History:  reports that she has never smoked. She has never used smokeless tobacco. She reports that she does not drink alcohol or use drugs.  ROS: UROLOGY Frequent Urination?: No Hard to postpone urination?: No Burning/pain with urination?: Yes Get up at night to urinate?: No Leakage of urine?: No Urine stream starts and stops?: No Trouble starting stream?: No Do you have to strain to urinate?: No Blood in urine?: No Urinary tract infection?: No Sexually transmitted disease?: No Injury to kidneys or bladder?: No Painful intercourse?: No Weak stream?: No Currently pregnant?: No Vaginal bleeding?: No Last menstrual period?: n  Gastrointestinal Nausea?: No Vomiting?: No Indigestion/heartburn?: No Diarrhea?: No Constipation?: No  Constitutional Fever: No Night sweats?: No Weight loss?: No Fatigue?: No  Skin Skin rash/lesions?: No Itching?: No  Eyes Blurred vision?: No Double vision?: No  Ears/Nose/Throat Sore  throat?: No Sinus problems?: No  Hematologic/Lymphatic Swollen glands?: No Easy bruising?: No  Cardiovascular Leg swelling?: No Chest pain?: No  Respiratory Cough?: No Shortness of breath?: No  Endocrine Excessive thirst?: No  Musculoskeletal Back pain?: No Joint pain?: No  Neurological Headaches?: No Dizziness?: No  Psychologic Depression?: No Anxiety?: No  Physical Exam: BP 125/70   Pulse 70   Ht 5' (1.524 m)   Wt 126 lb (57.2 kg)   BMI 24.61 kg/m   Constitutional:  Well nourished. Alert and oriented, No acute distress. HEENT: [place] AT, moist mucus membranes.  Trachea midline, no masses. Cardiovascular: No clubbing, cyanosis, or edema. Respiratory: Normal respiratory effort, no increased work of breathing. GI: Abdomen is soft, non tender, non distended, no abdominal masses. Liver and spleen not palpable.  No hernias appreciated.  Stool sample for occult testing is not indicated.   GU: No CVA tenderness.  No bladder fullness or masses.  Atrophic external genitalia, normal pubic hair distribution, no lesions.  Normal urethral meatus, no lesions, no prolapse, no discharge.   No urethral masses, tenderness and/or tenderness. No bladder fullness, tenderness or masses. Pale vagina mucosa, poor estrogen effect, no discharge, no lesions, good pelvic support, no cystocele and no rectocele noted.  Cervix and uterus are surgically absent.   No adnexal/parametria masses or tenderness noted.  Anus and perineum are without rashes or lesions.    Skin: No rashes, bruises or suspicious lesions. Lymph: No cervical or inguinal adenopathy. Neurologic: Grossly intact, no focal deficits, moving all 4 extremities. Psychiatric: Normal mood and affect.   Laboratory Data: 6-10 WBC's.  See Epic. I have reviewed the labs.  Pertinent Imaging: Results for CAMYA, HAYDON (MRN [id number]) as of 07/13/2018 14:49  Ref. Range 07/13/2018 14:22  Scan Result Unknown 64ml    Assessment & Plan:     1. Vaginal yeast infection Resolved   2.  Vaginal atrophy  Continue vaginal estrogen cream three nights weekly  3. OAB Well controlled on oxybutynin - refills given  4. Dysuria UA with some leucocytes Will send for culture and prescribe an antibiotic once culture results are available if appropriate     Return for pending urine culture results .  These notes generated with voice recognition software. I apologize for typographical errors.  Zara Council, PA-C  [facility] [address] [place], [place] [address] [phone]

## 2018-07-13 ENCOUNTER — Ambulatory Visit (INDEPENDENT_AMBULATORY_CARE_PROVIDER_SITE_OTHER): Payer: BLUE CROSS/BLUE SHIELD | Admitting: Urology

## 2018-07-13 ENCOUNTER — Other Ambulatory Visit
Admission: RE | Admit: 2018-07-13 | Discharge: 2018-07-13 | Disposition: A | Discharge status: Home / Self Care | Payer: BLUE CROSS/BLUE SHIELD | Attending: Urology | Admitting: Urology

## 2018-07-13 ENCOUNTER — Encounter: Payer: Self-pay | Admitting: Urology

## 2018-07-13 VITALS — BP 125/70 | HR 70 | Ht 60.0 in | Wt 126.0 lb

## 2018-07-13 DIAGNOSIS — N952 Postmenopausal atrophic vaginitis: Secondary | ICD-10-CM

## 2018-07-13 DIAGNOSIS — N3941 Urge incontinence: Secondary | ICD-10-CM

## 2018-07-13 DIAGNOSIS — N3281 Overactive bladder: Secondary | ICD-10-CM

## 2018-07-13 LAB — BLADDER SCAN AMB NON-IMAGING

## 2018-07-13 LAB — URINALYSIS, COMPLETE (UACMP) WITH MICROSCOPIC
Bacteria, UA: NONE SEEN
Bilirubin Urine: NEGATIVE
GLUCOSE, UA: NEGATIVE mg/dL
Hgb urine dipstick: NEGATIVE
Ketones, ur: NEGATIVE mg/dL
Nitrite: NEGATIVE
PROTEIN: NEGATIVE mg/dL
RBC / HPF: NONE SEEN RBC/hpf (ref 0–5)
Specific Gravity, Urine: 1.02 (ref 1.005–1.030)
pH: 7 (ref 5.0–8.0)

## 2018-07-13 MED ORDER — OXYBUTYNIN CHLORIDE ER 15 MG PO TB24: 15.0000 mg | ORAL_TABLET | Freq: Every day | ORAL | 3 refills | Status: DC

## 2018-07-15 LAB — URINE CULTURE: Culture: NO GROWTH

## 2018-07-16 ENCOUNTER — Telehealth: Payer: Self-pay

## 2018-07-16 NOTE — Telephone Encounter (Signed)
Patient notified

## 2018-07-16 NOTE — Telephone Encounter (Signed)
-----   Message from Nori Riis, PA-C sent at 07/16/2018  6:58 AM EST ----- Please let Mrs. Blincoe know that her urine culture was negative.

## 2018-09-09 ENCOUNTER — Telehealth: Payer: Self-pay | Admitting: Urology

## 2018-09-09 DIAGNOSIS — N3281 Overactive bladder: Secondary | ICD-10-CM

## 2018-09-09 MED ORDER — OXYBUTYNIN CHLORIDE ER 15 MG PO TB24: 15.0000 mg | ORAL_TABLET | Freq: Every day | ORAL | 3 refills | Status: DC

## 2018-09-09 NOTE — Telephone Encounter (Signed)
Pt would like to start getting her Oxybutnin through [facility] for a 90 day supply [phone]

## 2018-09-09 NOTE — Telephone Encounter (Signed)
RX sent to pharmacy  

## 2019-03-16 ENCOUNTER — Encounter: Payer: Self-pay | Admitting: Physician Assistant

## 2019-03-16 ENCOUNTER — Other Ambulatory Visit: Payer: Self-pay | Admitting: Physician Assistant

## 2019-03-16 ENCOUNTER — Other Ambulatory Visit: Payer: Self-pay

## 2019-03-16 ENCOUNTER — Ambulatory Visit: Payer: BLUE CROSS/BLUE SHIELD | Admitting: Physician Assistant

## 2019-03-16 VITALS — BP 111/73 | HR 81 | Ht 60.0 in | Wt 121.0 lb

## 2019-03-16 DIAGNOSIS — N898 Other specified noninflammatory disorders of vagina: Secondary | ICD-10-CM

## 2019-03-16 DIAGNOSIS — R82998 Other abnormal findings in urine: Secondary | ICD-10-CM | POA: Diagnosis not present

## 2019-03-16 DIAGNOSIS — N390 Urinary tract infection, site not specified: Secondary | ICD-10-CM | POA: Diagnosis not present

## 2019-03-16 LAB — MICROSCOPIC EXAMINATION: Bacteria, UA: NONE SEEN

## 2019-03-16 LAB — URINALYSIS, COMPLETE
Bilirubin, UA: NEGATIVE
Glucose, UA: NEGATIVE
Ketones, UA: NEGATIVE
Nitrite, UA: NEGATIVE
Protein,UA: NEGATIVE
RBC, UA: NEGATIVE
Specific Gravity, UA: 1.02 (ref 1.005–1.030)
Urobilinogen, Ur: 0.2 mg/dL (ref 0.2–1.0)
pH, UA: 6.5 (ref 5.0–7.5)

## 2019-03-16 NOTE — Progress Notes (Signed)
03/16/2019 11:27 AM   Chontel T Mcafee 03/10/1956 [id number]  CC: Dysuria, nausea, dizziness, dark urine  HPI: Melinda Jenkins is a 63 y.o. female who presents today for evaluation of possible UTI. She is an established [facility] patient who last saw Zara Council on 07/13/2018 for follow-up of vaginal candidiasis and vaginal atrophy.  She reports a 2-week history of urethral burning, dark urine, vaginal odor, and intermittent dizziness. She denies anorexia, muscle pain, nausea, vomiting, fevers, chills, vaginal discharge, and vaginal pruritis. She states her urine is particularly dark in the mornings. She says she is staying well hydrated. She denies a history of hypoglycemia, nephrolithiasis, or kidney disease. She is sexually active, no recent change in partners.   She does not have a history of recurrent UTI. She has been prescribed Premarin cream for vaginal atrophy but does not take it consistently. She is s/p hysterectomy. She has occasional diarrhea.  In-office UA today positive for trace leukocyte esterase; urine microscopy negative.  PMH: Past Medical History:  Diagnosis Date   Acute antritis 05/03/2015   Chronic constipation 05/03/2015   CN (constipation) 01/20/2009   Enterogastritis 05/03/2015   GERD (gastroesophageal reflux disease)    Hemorrhoids    Found on Colonoscopy (03/06/15)   Internal hemorrhoids without complication [id number]   Severe obstructive sleep apnea 05/03/2015    Surgical History: Past Surgical History:  Procedure Laterality Date   ABDOMINAL HYSTERECTOMY  1993   Partial- Dr. Randon Goldsmith   APPENDECTOMY  1993   Dr. Randon Goldsmith   BREAST SURGERY Left 1972   Cyst Removal- Benign   COLONOSCOPY WITH PROPOFOL N/A 03/06/2015   Procedure: COLONOSCOPY WITH PROPOFOL;  Surgeon: Hulen Luster, MD;  Location: [facility] ENDOSCOPY;  Service: Gastroenterology;  Laterality: N/A;   HEMORROIDECTOMY  03/31/2009   Dr. Pat Patrick   OVARIAN CYST REMOVAL  1986   TONSILLECTOMY  as child      Home Medications:  Allergies as of 03/16/2019      Reactions   Codeine Itching      Medication List       Accurate as of March 16, 2019 11:59 PM. If you have any questions, ask your nurse or doctor.        amoxicillin 500 MG capsule Commonly known as: AMOXIL Take 500 mg by mouth daily.   conjugated estrogens vaginal cream Commonly known as: Premarin Apply 0.5mg  (pea-sized amount)  just inside the vaginal introitus with a finger-tip on  Monday, Wednesday and Friday nights.   Linzess 290 MCG Caps capsule Generic drug: linaclotide Take 290 mcg by mouth daily.   oxybutynin 15 MG 24 hr tablet Commonly known as: DITROPAN XL Take 1 tablet (15 mg total) by mouth daily.       Allergies:  Allergies  Allergen Reactions   Codeine Itching    Family History: Family History  Problem Relation Age of Onset   Parkinson's disease Father    Colon polyps Father    Stroke Father    Heart disease Father    Colon polyps Mother    Cancer Brother    Hypertension Brother    Cancer Maternal Grandmother        Mouth   Heart disease Paternal Grandmother        Cardiomegaly   Parkinson's disease Paternal Grandfather    Kidney cancer Neg Hx    Bladder Cancer Neg Hx    Kidney disease Neg Hx     Social History:   reports that she has never smoked. She  has never used smokeless tobacco. She reports that she does not drink alcohol or use drugs.  ROS: UROLOGY Frequent Urination?: Yes Hard to postpone urination?: No Burning/pain with urination?: No Get up at night to urinate?: Yes Leakage of urine?: No Urine stream starts and stops?: Yes Trouble starting stream?: Yes Do you have to strain to urinate?: No Blood in urine?: No Urinary tract infection?: No Sexually transmitted disease?: No Injury to kidneys or bladder?: No Painful intercourse?: No Weak stream?: No Currently pregnant?: No Vaginal bleeding?: No Last menstrual period?:  n  Gastrointestinal Nausea?: Yes Vomiting?: No Indigestion/heartburn?: No Diarrhea?: Yes Constipation?: No  Constitutional Fever: No Night sweats?: No Weight loss?: Yes Fatigue?: Yes  Skin Skin rash/lesions?: No Itching?: No  Eyes Blurred vision?: Yes Double vision?: No  Ears/Nose/Throat Sore throat?: No Sinus problems?: No  Hematologic/Lymphatic Swollen glands?: No Easy bruising?: No  Cardiovascular Leg swelling?: No Chest pain?: Yes  Respiratory Cough?: No Shortness of breath?: No  Endocrine Excessive thirst?: No  Musculoskeletal Back pain?: No Joint pain?: No  Neurological Headaches?: Yes Dizziness?: Yes  Psychologic Depression?: No Anxiety?: No  Physical Exam: BP 111/73    Pulse 81    Ht 5' (1.524 m)    Wt 121 lb (54.9 kg)    BMI 23.63 kg/m   Constitutional:  Alert and oriented, no acute distress, nontoxic appearing HEENT: [place], AT Cardiovascular: No clubbing, cyanosis, or edema Respiratory: Normal respiratory effort, no increased work of breathing GU: Clear vaginal discharge, no odor. Skin: No rashes, bruises or suspicious lesions Neurologic: Grossly intact, no focal deficits, moving all 4 extremities Psychiatric: Normal mood and affect  Laboratory Data: Results for orders placed or performed in visit on 03/16/19  Microscopic Examination   URINE  Result Value Ref Range   WBC, UA 0-5 0 - 5 /hpf   RBC 0-2 0 - 2 /hpf   Epithelial Cells (non renal) 0-10 0 - 10 /hpf   Renal Epithel, UA 0-10 (A) None seen /hpf   Bacteria, UA None seen None seen/Few  Urinalysis, Complete  Result Value Ref Range   Specific Gravity, UA 1.020 1.005 - 1.030   pH, UA 6.5 5.0 - 7.5   Color, UA Yellow Yellow   Appearance Ur Clear Clear   Leukocytes,UA Trace (A) Negative   Protein,UA Negative Negative/Trace   Glucose, UA Negative Negative   Ketones, UA Negative Negative   RBC, UA Negative Negative   Bilirubin, UA Negative Negative   Urobilinogen, Ur 0.2 0.2  - 1.0 mg/dL   Nitrite, UA Negative Negative   Microscopic Examination See below:   Basic metabolic panel  Result Value Ref Range   Glucose 88 65 - 99 mg/dL   BUN 11 8 - 27 mg/dL   Creatinine, Ser 0.65 0.57 - 1.00 mg/dL   GFR calc non Af Amer 94 >59 mL/min/1.73   GFR calc Af Amer 109 >59 mL/min/1.73   BUN/Creatinine Ratio 17 12 - 28   Sodium 141 134 - 144 mmol/L   Potassium 4.4 3.5 - 5.2 mmol/L   Chloride 101 96 - 106 mmol/L   CO2 26 20 - 29 mmol/L   Calcium 9.7 8.7 - 10.3 mg/dL  HIV antibody (with reflex)  Result Value Ref Range   HIV Screen 4th Generation wRfx Non Reactive Non Reactive   Assessment & Plan:   Patient with nonspecific symptoms today; I am not convinced that her underlying cause is urological.    UA negative, will send for culture given complaint  of burning.  However, I believe her burning is most likely related to her known atrophic vaginitis.  She reports inconsistent use of Premarin cream.  I advised her to start using this consistently 3 times weekly for improvement of her burning symptoms.   No odor noted on physical exam today.  Vaginal discharge was clear and unconcerning for candidiasis or BV.  Will add on STI testing to urine sample for possible infectious causes.  Urine sample yellow and clear per lab today.  Specific gravity 1.020.  I do not believe that her urine is particularly concentrated.  I advised her that it is normal for morning urine to be more concentrated.  This may explain her reported symptom.  I will order a BMP today to evaluate renal function.  In the meantime, I counseled the patient that if her symptoms worsen or fail to improve, she should follow-up with her primary care provider.  She expressed an understanding of this plan. - Urinalysis, Complete - Urine culture - BMP - G/C probe amp - HIV antibody  Debroah Loop, PA-C  [facility] [address] [place], [place] [address] [phone]

## 2019-03-17 LAB — BASIC METABOLIC PANEL
BUN/Creatinine Ratio: 17 (ref 12–28)
BUN: 11 mg/dL (ref 8–27)
CO2: 26 mmol/L (ref 20–29)
Calcium: 9.7 mg/dL (ref 8.7–10.3)
Chloride: 101 mmol/L (ref 96–106)
Creatinine, Ser: 0.65 mg/dL (ref 0.57–1.00)
GFR calc Af Amer: 109 mL/min/{1.73_m2} (ref >59)
GFR calc non Af Amer: 94 mL/min/{1.73_m2} (ref >59)
Glucose: 88 mg/dL (ref 65–99)
Potassium: 4.4 mmol/L (ref 3.5–5.2)
Sodium: 141 mmol/L (ref 134–144)

## 2019-03-17 LAB — HIV ANTIBODY (ROUTINE TESTING W REFLEX): HIV Screen 4th Generation wRfx: NONREACTIVE

## 2019-03-18 ENCOUNTER — Ambulatory Visit: Payer: BLUE CROSS/BLUE SHIELD | Admitting: Urology

## 2019-03-18 LAB — CULTURE, URINE COMPREHENSIVE

## 2019-03-19 ENCOUNTER — Telehealth: Payer: Self-pay | Admitting: Physician Assistant

## 2019-03-19 LAB — GC/CHLAMYDIA PROBE AMP
Chlamydia trachomatis, NAA: NEGATIVE
Neisseria Gonorrhoeae by PCR: NEGATIVE

## 2019-03-19 NOTE — Telephone Encounter (Signed)
Please contact the patient and inform her that her test results have all come back negative.  She does not have a UTI.  If her symptoms persist, she should follow-up with primary care.

## 2019-03-19 NOTE — Telephone Encounter (Signed)
LMOM for patient to return call.

## 2019-03-19 NOTE — Telephone Encounter (Signed)
Pt called office and I read message from Sam.

## 2019-03-25 ENCOUNTER — Telehealth: Payer: Self-pay | Admitting: Urology

## 2019-03-25 NOTE — Telephone Encounter (Signed)
Pt needs refill for Linzess sent to [facility].  She saw Sam last week and forgot to mention this.

## 2019-03-25 NOTE — Telephone Encounter (Signed)
Upon chart review, it appears this med has historically been prescribed by gastroenterology. Contacted patient to confirm, she states she forgot this and will follow up with them for refill.

## 2019-03-25 NOTE — Telephone Encounter (Signed)
Please advise on Linzess

## 2019-06-28 DIAGNOSIS — R05 Cough: Secondary | ICD-10-CM | POA: Diagnosis not present

## 2019-06-28 DIAGNOSIS — M791 Myalgia, unspecified site: Secondary | ICD-10-CM | POA: Diagnosis not present

## 2019-06-28 DIAGNOSIS — Z20828 Contact with and (suspected) exposure to other viral communicable diseases: Secondary | ICD-10-CM | POA: Diagnosis not present

## 2019-07-01 DIAGNOSIS — M545 Low back pain: Secondary | ICD-10-CM | POA: Diagnosis not present

## 2019-07-01 DIAGNOSIS — Z20828 Contact with and (suspected) exposure to other viral communicable diseases: Secondary | ICD-10-CM | POA: Diagnosis not present

## 2019-08-04 DIAGNOSIS — S92322A Displaced fracture of second metatarsal bone, left foot, initial encounter for closed fracture: Secondary | ICD-10-CM | POA: Diagnosis not present

## 2019-08-04 DIAGNOSIS — S93325D Dislocation of tarsometatarsal joint of left foot, subsequent encounter: Secondary | ICD-10-CM | POA: Diagnosis not present

## 2019-08-04 DIAGNOSIS — S92335A Nondisplaced fracture of third metatarsal bone, left foot, initial encounter for closed fracture: Secondary | ICD-10-CM | POA: Diagnosis not present

## 2019-08-04 DIAGNOSIS — S92342A Displaced fracture of fourth metatarsal bone, left foot, initial encounter for closed fracture: Secondary | ICD-10-CM | POA: Diagnosis not present

## 2019-08-04 DIAGNOSIS — S93325A Dislocation of tarsometatarsal joint of left foot, initial encounter: Secondary | ICD-10-CM | POA: Diagnosis not present

## 2019-08-12 ENCOUNTER — Telehealth: Payer: Self-pay | Admitting: Urology

## 2019-08-12 NOTE — Telephone Encounter (Signed)
Pt wanted [place] to know that she has changed pharmacies to [facility] in [place].

## 2019-08-13 NOTE — Telephone Encounter (Signed)
Pharmacy has been updated.

## 2019-08-16 ENCOUNTER — Telehealth: Payer: Self-pay | Admitting: Urology

## 2019-08-16 DIAGNOSIS — N3281 Overactive bladder: Secondary | ICD-10-CM

## 2019-08-16 MED ORDER — OXYBUTYNIN CHLORIDE ER 15 MG PO TB24: 15.0000 mg | ORAL_TABLET | Freq: Every day | ORAL | 3 refills | Status: DC

## 2019-08-16 NOTE — Telephone Encounter (Signed)
RX sent to pharmacy  

## 2019-08-16 NOTE — Telephone Encounter (Signed)
Pt asking for refill of Oxybutinin to be sent to [facility] in [place]. Please advise. Thanks.

## 2019-10-11 DIAGNOSIS — K5909 Other constipation: Secondary | ICD-10-CM | POA: Diagnosis not present

## 2019-10-11 DIAGNOSIS — Z8616 Personal history of COVID-19: Secondary | ICD-10-CM | POA: Diagnosis not present

## 2019-10-11 DIAGNOSIS — R197 Diarrhea, unspecified: Secondary | ICD-10-CM | POA: Diagnosis not present

## 2019-10-11 DIAGNOSIS — R110 Nausea: Secondary | ICD-10-CM | POA: Diagnosis not present

## 2019-10-11 DIAGNOSIS — K644 Residual hemorrhoidal skin tags: Secondary | ICD-10-CM | POA: Diagnosis not present

## 2019-10-11 DIAGNOSIS — K648 Other hemorrhoids: Secondary | ICD-10-CM | POA: Diagnosis not present

## 2019-10-11 DIAGNOSIS — K219 Gastro-esophageal reflux disease without esophagitis: Secondary | ICD-10-CM | POA: Diagnosis not present

## 2020-03-05 NOTE — Progress Notes (Signed)
03/06/2020 10:25 PM   Melinda Jenkins [birth year] [id number]  Referring provider: Jerrol Banana., MD [address] [place],  [place] [address] Chief Complaint  Patient presents with  . Urinary Incontinence    HPI: Melinda Jenkins is a 64 y.o. female who presents today for an annual visit.   She was last evaluated by Melinda Loop, PA-C for a possible UTI on 03/16/2019. She reported a 2-week history of urethral burning, dark urine, vaginal odor, and intermittent dizziness. She denied anorexia, muscle pain, nausea, vomiting, fevers, chills, vaginal discharge, and vaginal pruritis. She stated her urine was particularly dark in the mornings. She was staying well hydrated. She had occasional diarrhea. In-office UA today positive for trace leukocyte esterase; urine microscopy negative; urine culture negative.  She was prescribed Premarin cream for vaginal atrophy but did not take it consistently. She was advised to use cream 3 times/week.  Today reports 1 episode of seeing blood on the tissue. In the last couple of weeks she has noticed a odor. Denies vaginal discharge.   The patient is experiencing urgency x 0/3 (0/3), frequency x 4/7 (4/7), not restricting fluids to avoid visits to the restroom, is engaging in toilet mapping, incontinence x 0/3 (0/3) and nocturia x 0/3 (0/3).   Her BP is 129/81.   Her PVR is 0 mL.   She reports having COVID-19 a few months ago. She reports having some red bumps and muscle spasms s/p COVID-19. She is skeptical about COVID-19 vaccine.   She denied a history of hypoglycemia, nephrolithiasis, or kidney disease. She is sexually active, no recent change in partners.   She does not have a history of recurrent UTI. She is s/p hysterectomy.   Former social smoker in early 28s.     PMH: Past Medical History:  Diagnosis Date  . Acute antritis 05/03/2015  . Chronic constipation 05/03/2015  . CN (constipation) 01/20/2009  .  Enterogastritis 05/03/2015  . GERD (gastroesophageal reflux disease)   . Hemorrhoids    Found on Colonoscopy (03/06/15)  . Internal hemorrhoids without complication 8/93/8101  . Severe obstructive sleep apnea 05/03/2015    Surgical History: Past Surgical History:  Procedure Laterality Date  . ABDOMINAL HYSTERECTOMY  1993   Partial- Melinda Jenkins  . APPENDECTOMY  1993   Melinda Jenkins  . BREAST SURGERY Left 1972   Cyst Removal- Benign  . COLONOSCOPY WITH PROPOFOL N/A 03/06/2015   Procedure: COLONOSCOPY WITH PROPOFOL;  Surgeon: Melinda Luster, MD;  Location: [facility] ENDOSCOPY;  Service: Gastroenterology;  Laterality: N/A;  . HEMORROIDECTOMY  03/31/2009   Dr. Pat Patrick  . OVARIAN CYST REMOVAL  1986  . TONSILLECTOMY  as child    Home Medications:  Allergies as of 03/06/2020      Reactions   Codeine Itching   Meclizine Itching, Rash      Medication List       Accurate as of March 06, 2020 11:59 PM. If you have any questions, ask your nurse or doctor.        amoxicillin 500 MG capsule Commonly known as: AMOXIL Take 500 mg by mouth daily.   conjugated estrogens vaginal cream Commonly known as: Premarin Apply 0.5mg  (pea-sized amount)  just inside the vaginal introitus with a finger-tip on  Monday, Wednesday and Friday nights.   diazepam 10 MG tablet Commonly known as: Valium Please take one tablet 30 minutes prior to CT scan and one 30 minutes prior to cystoscopy.  You must have a driver.  Started by: Melinda Council, PA-C   Linzess 290 MCG Caps capsule Generic drug: linaclotide Take 290 mcg by mouth daily.   oxybutynin 15 MG 24 hr tablet Commonly known as: DITROPAN XL Take 1 tablet (15 mg total) by mouth daily.       Allergies:  Allergies  Allergen Reactions  . Codeine Itching  . Meclizine Itching and Rash    Family History: Family History  Problem Relation Age of Onset  . Parkinson's disease Father   . Colon polyps Father   . Stroke Father   . Heart disease Father   .  Colon polyps Mother   . Cancer Brother   . Hypertension Brother   . Cancer Maternal Grandmother        Mouth  . Heart disease Paternal Grandmother        Cardiomegaly  . Parkinson's disease Paternal Grandfather   . Kidney cancer Neg Hx   . Bladder Cancer Neg Hx   . Kidney disease Neg Hx     Social History:  reports that she has never smoked. She has never used smokeless tobacco. She reports that she does not drink alcohol and does not use drugs.   Physical Exam: BP 129/81   Pulse 76   Ht 5' (1.524 m)   Wt 123 lb 11.2 oz (56.1 kg)   BMI 24.16 kg/m   Constitutional:  Alert and oriented, No acute distress. HEENT: [place] AT, mask in place.  Trachea midline Cardiovascular: No clubbing, cyanosis, or edema. Respiratory: Normal respiratory effort, no increased work of breathing. Skin: No rashes, bruises or suspicious lesions. Neurologic: Grossly intact, no focal deficits, moving all 4 extremities. Psychiatric: Normal mood and affect.   Urinalysis Component     Latest Ref Rng & Units 03/06/2020  Specific Gravity, UA     1.005 - 1.030 1.025  pH, UA     5.0 - 7.5 5.5  Color, UA     Yellow Yellow  Appearance Ur     Clear Clear  Leukocytes,UA     Negative Negative  Protein,UA     Negative/Trace Negative  Glucose, UA     Negative Negative  Ketones, UA     Negative Negative  RBC, UA     Negative Negative  Bilirubin, UA     Negative Negative  Urobilinogen, Ur     0.2 - 1.0 mg/dL 0.2  Nitrite, UA     Negative Negative  Microscopic Examination      See below:   Component     Latest Ref Rng & Units 03/06/2020  WBC, UA     0 - 5 /hpf 0-5  RBC     0 - 2 /hpf None seen  Epithelial Cells (non renal)     0 - 10 /hpf 0-10  Bacteria, UA     None seen/Few None seen    Pertinent image Results for orders placed or performed in visit on 03/06/20  Microscopic Examination   Urine  Result Value Ref Range   WBC, UA 0-5 0 - 5 /hpf   RBC None seen 0 - 2 /hpf   Epithelial Cells  (non renal) 0-10 0 - 10 /hpf   Bacteria, UA None seen None seen/Few  Urinalysis, Complete  Result Value Ref Range   Specific Gravity, UA 1.025 1.005 - 1.030   pH, UA 5.5 5.0 - 7.5   Color, UA Yellow Yellow   Appearance Ur Clear Clear   Leukocytes,UA Negative Negative   Protein,UA Negative  Negative/Trace   Glucose, UA Negative Negative   Ketones, UA Negative Negative   RBC, UA Negative Negative   Bilirubin, UA Negative Negative   Urobilinogen, Ur 0.2 0.2 - 1.0 mg/dL   Nitrite, UA Negative Negative   Microscopic Examination See below:   Bladder Scan (Post Void Residual) in office  Result Value Ref Range   Scan Result 0      Assessment & Plan:    1. Gross hematuria  Patient is a former smoker.   UA today negative for micro heme  We discussed the differential diagnosis for gross hematuria including nephrolithiasis, renal or upper tract tumors, bladder stones, UTIs, or bladder tumors as well as undetermined etiologies. Per AUA guidelines, I did recommend complete hematuria evaluation including CTU, possible urine cytology, and office cystoscopy.  Patient will receive a Valium prior to CTU and cysto. Patient made aware of risk and benefits.   2. OAB/Urinary incontinence  OAB symptoms are stable.  PVR is 0 mL.   Return for CT Urogram report and cystoscopy.  [facility] [address], [place], [place] [address] [phone]  I, Selena Batten, am acting as a scribe for [employer],  I have reviewed the above documentation for accuracy and completeness, and I agree with the above.    Melinda Council, PA-C

## 2020-03-06 ENCOUNTER — Encounter: Payer: Self-pay | Admitting: Urology

## 2020-03-06 ENCOUNTER — Ambulatory Visit: Payer: PPO | Admitting: Urology

## 2020-03-06 ENCOUNTER — Other Ambulatory Visit: Payer: Self-pay

## 2020-03-06 VITALS — BP 129/81 | HR 76 | Ht 60.0 in | Wt 123.7 lb

## 2020-03-06 DIAGNOSIS — N390 Urinary tract infection, site not specified: Secondary | ICD-10-CM

## 2020-03-06 DIAGNOSIS — N3281 Overactive bladder: Secondary | ICD-10-CM

## 2020-03-06 DIAGNOSIS — R310 Gross hematuria: Secondary | ICD-10-CM

## 2020-03-06 LAB — URINALYSIS, COMPLETE
Bilirubin, UA: NEGATIVE
Glucose, UA: NEGATIVE
Ketones, UA: NEGATIVE
Leukocytes,UA: NEGATIVE
Nitrite, UA: NEGATIVE
Protein,UA: NEGATIVE
RBC, UA: NEGATIVE
Specific Gravity, UA: 1.025 (ref 1.005–1.030)
Urobilinogen, Ur: 0.2 mg/dL (ref 0.2–1.0)
pH, UA: 5.5 (ref 5.0–7.5)

## 2020-03-06 LAB — MICROSCOPIC EXAMINATION
Bacteria, UA: NONE SEEN
RBC, Urine: NONE SEEN /hpf (ref 0–2)

## 2020-03-06 LAB — BLADDER SCAN AMB NON-IMAGING: Scan Result: 0

## 2020-03-06 MED ORDER — DIAZEPAM 10 MG PO TABS: ORAL_TABLET | ORAL | 0 refills | Status: DC

## 2020-03-16 ENCOUNTER — Ambulatory Visit: Payer: BLUE CROSS/BLUE SHIELD | Admitting: Urology

## 2020-04-11 ENCOUNTER — Other Ambulatory Visit: Payer: Self-pay | Admitting: Urology

## 2020-04-19 ENCOUNTER — Other Ambulatory Visit: Payer: Self-pay | Admitting: Urology

## 2020-04-19 ENCOUNTER — Other Ambulatory Visit: Payer: Self-pay | Admitting: Family Medicine

## 2020-04-19 DIAGNOSIS — R350 Frequency of micturition: Secondary | ICD-10-CM

## 2020-04-19 DIAGNOSIS — N3281 Overactive bladder: Secondary | ICD-10-CM

## 2020-04-19 NOTE — Progress Notes (Signed)
this pt is on the phone asking if she can leave a urine sample for kidney pain, sluggish, painful to walk, pressure, frequency. Denies painful urination, burning/fever/chills. How should I proceed with an appt  she will be leaving an UA and Urine culture.  She is to be scheduled for CT urogram, but she states she is dealing with her insurance about coverage.

## 2020-04-20 ENCOUNTER — Other Ambulatory Visit: Payer: Self-pay

## 2020-04-20 ENCOUNTER — Other Ambulatory Visit: Payer: PPO

## 2020-04-20 DIAGNOSIS — N3281 Overactive bladder: Secondary | ICD-10-CM | POA: Diagnosis not present

## 2020-04-21 LAB — URINALYSIS, COMPLETE
Bilirubin, UA: NEGATIVE
Glucose, UA: NEGATIVE
Ketones, UA: NEGATIVE
Nitrite, UA: NEGATIVE
Protein,UA: NEGATIVE
RBC, UA: NEGATIVE
Specific Gravity, UA: 1.015 (ref 1.005–1.030)
Urobilinogen, Ur: 0.2 mg/dL (ref 0.2–1.0)
pH, UA: 6 (ref 5.0–7.5)

## 2020-04-21 LAB — MICROSCOPIC EXAMINATION

## 2020-04-24 LAB — CULTURE, URINE COMPREHENSIVE

## 2020-04-25 ENCOUNTER — Other Ambulatory Visit: Payer: Self-pay | Admitting: Urology

## 2020-04-25 ENCOUNTER — Telehealth: Payer: Self-pay | Admitting: Family Medicine

## 2020-04-25 NOTE — Telephone Encounter (Signed)
-----   Message from Nori Riis, PA-C sent at 04/25/2020  7:57 AM EDT ----- Please let Mrs. Mcphearson know that her urine culture was negative for infection.  We need to get her scheduled for her CT urogram and cystoscopy.

## 2020-04-25 NOTE — Telephone Encounter (Signed)
Patient notified and will call back to schedule Cysto.

## 2020-05-29 ENCOUNTER — Other Ambulatory Visit: Payer: Self-pay | Admitting: Family Medicine

## 2020-05-29 DIAGNOSIS — N3281 Overactive bladder: Secondary | ICD-10-CM

## 2020-05-30 ENCOUNTER — Other Ambulatory Visit: Payer: Self-pay

## 2020-05-30 ENCOUNTER — Ambulatory Visit
Admission: RE | Admit: 2020-05-30 | Discharge: 2020-05-30 | Disposition: A | Discharge status: Home / Self Care | Payer: PPO | Source: Ambulatory Visit | Attending: Urology | Admitting: Urology

## 2020-05-30 ENCOUNTER — Other Ambulatory Visit
Admission: RE | Admit: 2020-05-30 | Discharge: 2020-05-30 | Disposition: A | Discharge status: Home / Self Care | Payer: PPO | Source: Home / Self Care | Attending: Urology | Admitting: Urology

## 2020-05-30 DIAGNOSIS — N3281 Overactive bladder: Secondary | ICD-10-CM | POA: Insufficient documentation

## 2020-05-30 DIAGNOSIS — Z90710 Acquired absence of both cervix and uterus: Secondary | ICD-10-CM | POA: Diagnosis not present

## 2020-05-30 DIAGNOSIS — R8290 Unspecified abnormal findings in urine: Secondary | ICD-10-CM | POA: Diagnosis not present

## 2020-05-30 DIAGNOSIS — R310 Gross hematuria: Secondary | ICD-10-CM | POA: Insufficient documentation

## 2020-05-30 DIAGNOSIS — R1031 Right lower quadrant pain: Secondary | ICD-10-CM | POA: Diagnosis not present

## 2020-05-30 LAB — CREATININE, SERUM
Creatinine, Ser: 0.66 mg/dL (ref 0.44–1.00)
GFR, Estimated: >60 mL/min (ref >60)

## 2020-05-30 MED ORDER — IOHEXOL 300 MG/ML  SOLN
125.0000 mL | Freq: Once | INTRAMUSCULAR | Status: AC | PRN
  Administered 2020-05-30: 125 mL via INTRAVENOUS

## 2020-06-06 ENCOUNTER — Other Ambulatory Visit: Payer: PPO | Admitting: Urology

## 2020-06-19 ENCOUNTER — Telehealth: Payer: Self-pay | Admitting: Urology

## 2020-06-19 NOTE — Telephone Encounter (Signed)
Please call Mrs. Strothers and have her reschedule her CT urogram report and cystoscopy?

## 2020-06-21 NOTE — Progress Notes (Signed)
03/06/2020 6:53 PM   Astria Thompson Caul July 19, 1956 [id number]  Referring provider: Birdie Sons, MD [address] [place],  [place] [address] Chief Complaint  Patient presents with  . Urinary Frequency    HPI: Melinda Jenkins is a 64 y.o. female who is currently in the midst of a hematuria work-up with CT urogram and cystoscopy who has postpone her cystoscopy due to the fact that she may be infected.  She has been experiencing worsening frequency, urgency, dysuria, difficulty urinating, weak urinary stream, suprapubic pain and vaginal pain.  She is also experiencing dyspareunia.   Patient denies any modifying or aggravating factors.  Patient denies any recent gross hematuria or flank pain.  Patient denies any fevers, chills, nausea or vomiting.   UA positive for 11-30 WBC's and many bacteria.   PVR is 0 mL.    Former social smoker in early 63s.    CT urogram 05/30/2020 a possible faint cystic mass with wall thickening and enhancement at the level of the pubic symphysis suspicious for an urethral diverticulum seen on series #4 (axial post) image 71/87   PMH: Past Medical History:  Diagnosis Date  . Acute antritis 05/03/2015  . Chronic constipation 05/03/2015  . CN (constipation) 01/20/2009  . Enterogastritis 05/03/2015  . GERD (gastroesophageal reflux disease)   . Hemorrhoids    Found on Colonoscopy (03/06/15)  . Internal hemorrhoids without complication 2/63/7858  . Severe obstructive sleep apnea 05/03/2015    Surgical History: Past Surgical History:  Procedure Laterality Date  . ABDOMINAL HYSTERECTOMY  1993   Partial- Dr. Randon Goldsmith  . APPENDECTOMY  1993   Dr. Randon Goldsmith  . BREAST SURGERY Left 1972   Cyst Removal- Benign  . COLONOSCOPY WITH PROPOFOL N/A 03/06/2015   Procedure: COLONOSCOPY WITH PROPOFOL;  Surgeon: Hulen Luster, MD;  Location: [facility] ENDOSCOPY;  Service: Gastroenterology;  Laterality: N/A;  . HEMORROIDECTOMY  03/31/2009   Dr. Pat Patrick  . OVARIAN CYST REMOVAL   1986  . TONSILLECTOMY  as child    Home Medications:  Allergies as of 06/22/2020      Reactions   Codeine Itching   Meclizine Itching, Rash      Medication List       Accurate as of June 22, 2020 11:59 PM. If you have any questions, ask your nurse or doctor.        amoxicillin 500 MG capsule Commonly known as: AMOXIL Take 500 mg by mouth daily.   conjugated estrogens vaginal cream Commonly known as: Premarin Apply 0.5mg  (pea-sized amount)  just inside the vaginal introitus with a finger-tip on  Monday, Wednesday and Friday nights.   diazepam 10 MG tablet Commonly known as: Valium Please take one tablet 30 minutes prior to CT scan and one 30 minutes prior to cystoscopy.  You must have a driver.   fluconazole 150 MG tablet Commonly known as: DIFLUCAN Take 1 tablet (150 mg total) by mouth once for 1 dose. Started by: Zara Council, PA-C   Linzess 290 MCG Caps capsule Generic drug: linaclotide Take 290 mcg by mouth daily.   oxybutynin 15 MG 24 hr tablet Commonly known as: DITROPAN XL Take 1 tablet (15 mg total) by mouth daily.       Allergies:  Allergies  Allergen Reactions  . Codeine Itching  . Meclizine Itching and Rash    Family History: Family History  Problem Relation Age of Onset  . Parkinson's disease Father   . Colon polyps Father   .  Stroke Father   . Heart disease Father   . Colon polyps Mother   . Cancer Brother   . Hypertension Brother   . Cancer Maternal Grandmother        Mouth  . Heart disease Paternal Grandmother        Cardiomegaly  . Parkinson's disease Paternal Grandfather   . Kidney cancer Neg Hx   . Bladder Cancer Neg Hx   . Kidney disease Neg Hx     Social History:  reports that she has never smoked. She has never used smokeless tobacco. She reports that she does not drink alcohol and does not use drugs.   Physical Exam: BP 121/79   Pulse 76   Ht 5' (1.524 m)   Wt 123 lb (55.8 kg)   BMI 24.02 kg/m    Constitutional:  Well nourished. Alert and oriented, No acute distress. HEENT: [place] AT, mask in place.  Trachea midline Cardiovascular: No clubbing, cyanosis, or edema. Respiratory: Normal respiratory effort, no increased work of breathing. GU: No CVA tenderness.  No bladder fullness or masses.  Atrophic external genitalia, normal pubic hair distribution, no lesions.  Normal urethral meatus, no lesions, no prolapse, no discharge.   No urethral masses, drainage and/or tenderness.  No bladder fullness, tenderness or masses. Pale vagina mucosa, fair estrogen effect, scant whitish discharge, no lesions, fair pelvic support, grade II cystocele and no rectocele noted.  Anus and perineum are without rashes or lesions.    Neurologic: Grossly intact, no focal deficits, moving all 4 extremities. Psychiatric: Normal mood and affect.   Urinalysis Component     Latest Ref Rng & Units 06/22/2020  Specific Gravity, UA     1.005 - 1.030 1.025  pH, UA     5.0 - 7.5 6.0  Color, UA     Yellow Yellow  Appearance Ur     Clear Cloudy (A)  Leukocytes,UA     Negative Trace (A)  Protein,UA     Negative/Trace Negative  Glucose, UA     Negative Negative  Ketones, UA     Negative Negative  RBC, UA     Negative Negative  Bilirubin, UA     Negative Negative  Urobilinogen, Ur     0.2 - 1.0 mg/dL 0.2  Nitrite, UA     Negative Negative  Microscopic Examination      See below:   Component     Latest Ref Rng & Units 06/22/2020  WBC, UA     0 - 5 /hpf 11-30 (A)  RBC     0 - 2 /hpf 0-2  Epithelial Cells (non renal)     0 - 10 /hpf 0-10  Casts     None seen /lpf Present (A)  Cast Type     N/A Hyaline casts  Mucus, UA     Not Estab. Present (A)  Bacteria, UA     None seen/Few Many (A)  I have reviewed the labs.    Pertinent Imaging Results for orders placed or performed in visit on 06/22/20  CULTURE, URINE COMPREHENSIVE   Specimen: Urine   UR  Result Value Ref Range   Urine Culture,  Comprehensive Final report    Organism ID, Bacteria Comment   Microscopic Examination   Urine  Result Value Ref Range   WBC, UA 11-30 (A) 0 - 5 /hpf   RBC 0-2 0 - 2 /hpf   Epithelial Cells (non renal) 0-10 0 - 10 /hpf   Casts Present (  A) None seen /lpf   Cast Type Hyaline casts N/A   Mucus, UA Present (A) Not Estab.   Bacteria, UA Many (A) None seen/Few  Urinalysis, Complete  Result Value Ref Range   Specific Gravity, UA 1.025 1.005 - 1.030   pH, UA 6.0 5.0 - 7.5   Color, UA Yellow Yellow   Appearance Ur Cloudy (A) Clear   Leukocytes,UA Trace (A) Negative   Protein,UA Negative Negative/Trace   Glucose, UA Negative Negative   Ketones, UA Negative Negative   RBC, UA Negative Negative   Bilirubin, UA Negative Negative   Urobilinogen, Ur 0.2 0.2 - 1.0 mg/dL   Nitrite, UA Negative Negative   Microscopic Examination See below:   Bladder Scan (Post Void Residual) in office  Result Value Ref Range   Scan Result 0    Narrative & Impression  CLINICAL DATA:  64 year old female with history of gross hematuria. Dark urine for the past 2-3 months. Right lower quadrant abdominal and flank pain.  EXAM: CT ABDOMEN AND PELVIS WITHOUT AND WITH CONTRAST  TECHNIQUE: Multidetector CT imaging of the abdomen and pelvis was performed following the standard protocol before and following the bolus administration of intravenous contrast.  CONTRAST:  181mL OMNIPAQUE IOHEXOL 300 MG/ML  SOLN  COMPARISON:  No priors.  FINDINGS: Lower chest: Mild scarring in the lower lobes of the lungs bilaterally.  Hepatobiliary: No suspicious cystic or solid hepatic lesions. No intra or extrahepatic biliary ductal dilatation. Gallbladder is normal in appearance.  Pancreas: No pancreatic mass. No pancreatic ductal dilatation. No pancreatic or peripancreatic fluid collections or inflammatory changes.  Spleen: Unremarkable.  Adrenals/Urinary Tract: Precontrast images demonstrate  no calcifications within the collecting system of either kidney, along the course of either ureter, or within the lumen of the urinary bladder. No hydroureteronephrosis. Post contrast images demonstrate no focal solid renal lesions. Post contrast delayed images demonstrate no definite filling defects within the collecting system of either kidney, along the course of either ureter, or within the lumen of the urinary bladder to strongly suggest the presence of a urothelial neoplasm. Urinary bladder is normal in appearance. Bilateral adrenal glands are normal in appearance.  Stomach/Bowel: Normal appearance of the stomach. No pathologic dilatation of small bowel or colon. The appendix is not confidently identified and may be surgically absent. Regardless, there are no inflammatory changes noted adjacent to the cecum to suggest the presence of an acute appendicitis at this time.  Vascular/Lymphatic: No significant atherosclerotic disease, aneurysm or dissection noted in the abdominal or pelvic vasculature. No lymphadenopathy noted in the abdomen or pelvis.  Reproductive: Status post hysterectomy. Ovaries are not confidently identified may be surgically absent or atrophic.  Other: No significant volume of ascites.  No pneumoperitoneum.  Musculoskeletal: There are no aggressive appearing lytic or blastic lesions noted in the visualized portions of the skeleton.  IMPRESSION: 1. No source for hematuria identified on today's examination. 2. No cause of right lower quadrant or right-sided flank pain identified on today's examination. 3. Incidental findings, as above.   Electronically Signed   By: Vinnie Langton M.D.   On: 05/31/2020 10:11   I have independently reviewed the films.  See HPI.    Assessment & Plan:    1. 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 Matilde Sprang pending cystoscopy results   3. OAB/Urinary incontinence  OAB symptoms have worsened Continue Ditropan XL 15 mg daily  4. Vaginal yeast infection A scant amount of whitish discharge on exam Patient with very irritative symptoms - given Diflucan for symptom relief   Return for return as scheduled for cystoscopy .  [facility] [address] [place], [place] [address] [phone]

## 2020-06-22 ENCOUNTER — Other Ambulatory Visit: Payer: Self-pay

## 2020-06-22 ENCOUNTER — Encounter: Payer: Self-pay | Admitting: Urology

## 2020-06-22 ENCOUNTER — Ambulatory Visit: Payer: PPO | Admitting: Urology

## 2020-06-22 VITALS — BP 121/79 | HR 76 | Ht 60.0 in | Wt 123.0 lb

## 2020-06-22 DIAGNOSIS — N3281 Overactive bladder: Secondary | ICD-10-CM | POA: Diagnosis not present

## 2020-06-22 DIAGNOSIS — R310 Gross hematuria: Secondary | ICD-10-CM | POA: Diagnosis not present

## 2020-06-22 DIAGNOSIS — N390 Urinary tract infection, site not specified: Secondary | ICD-10-CM

## 2020-06-22 LAB — BLADDER SCAN AMB NON-IMAGING: Scan Result: 0

## 2020-06-22 MED ORDER — FLUCONAZOLE 150 MG PO TABS: 150.0000 mg | ORAL_TABLET | Freq: Once | ORAL | 0 refills | Status: AC

## 2020-06-22 NOTE — Patient Instructions (Addendum)
Boric acid vaginal suppositories every night for three days, then once a week as needed

## 2020-06-23 LAB — URINALYSIS, COMPLETE
Bilirubin, UA: NEGATIVE
Glucose, UA: NEGATIVE
Ketones, UA: NEGATIVE
Nitrite, UA: NEGATIVE
Protein,UA: NEGATIVE
RBC, UA: NEGATIVE
Specific Gravity, UA: 1.025 (ref 1.005–1.030)
Urobilinogen, Ur: 0.2 mg/dL (ref 0.2–1.0)
pH, UA: 6 (ref 5.0–7.5)

## 2020-06-23 LAB — MICROSCOPIC EXAMINATION

## 2020-06-23 MED ORDER — OXYBUTYNIN CHLORIDE ER 15 MG PO TB24: 15.0000 mg | ORAL_TABLET | Freq: Every day | ORAL | 3 refills | Status: DC

## 2020-06-26 ENCOUNTER — Encounter: Payer: Self-pay | Admitting: Family Medicine

## 2020-06-26 LAB — CULTURE, URINE COMPREHENSIVE

## 2020-06-26 NOTE — Telephone Encounter (Signed)
PATIENT CB GAVE HER THE RESULTS  Melinda Jenkins

## 2020-07-05 ENCOUNTER — Other Ambulatory Visit: Payer: Self-pay

## 2020-07-05 ENCOUNTER — Ambulatory Visit (INDEPENDENT_AMBULATORY_CARE_PROVIDER_SITE_OTHER): Payer: PPO | Admitting: Urology

## 2020-07-05 ENCOUNTER — Other Ambulatory Visit: Payer: Self-pay | Admitting: Urology

## 2020-07-05 ENCOUNTER — Encounter: Payer: Self-pay | Admitting: Urology

## 2020-07-05 VITALS — BP 128/85 | HR 96 | Ht 60.0 in | Wt 127.8 lb

## 2020-07-05 DIAGNOSIS — N362 Urethral caruncle: Secondary | ICD-10-CM

## 2020-07-05 DIAGNOSIS — R310 Gross hematuria: Secondary | ICD-10-CM | POA: Diagnosis not present

## 2020-07-05 DIAGNOSIS — N952 Postmenopausal atrophic vaginitis: Secondary | ICD-10-CM

## 2020-07-05 MED ORDER — PREMARIN 0.625 MG/GM VA CREA: TOPICAL_CREAM | VAGINAL | 12 refills | Status: DC

## 2020-07-05 NOTE — Progress Notes (Signed)
Cystoscopy Procedure Note:  Indication: Gross hematuria  After informed consent and discussion of the procedure and its risks, Melinda Jenkins was positioned and prepped in the standard fashion.  Externally she appeared to have a subtle urethral caruncle.  There is no tenderness or mass at the urethra.  Cystoscopy was performed with a flexible cystoscope. The urethra, bladder neck and entire bladder was visualized in a standard fashion. The ureteral orifices were visualized in their normal location and orientation.  No bladder lesions.  No abnormalities on retroflexion.  Cytology sent.  Careful pullback cystoscopy showed no urethral abnormalities.  Imaging: CT urogram with no nephrolithiasis renal masses, or other abnormalities  Findings: Normal cystoscopy, small urethral caruncle on exam  Assessment and Plan: Trial of estrogen cream for small urethral caruncle, likely etiology of her gross hematuria Call with cytology results RTC 3 months with PA for symptom check  Nickolas Madrid, MD 07/05/2020

## 2020-07-06 LAB — URINALYSIS, COMPLETE
Bilirubin, UA: NEGATIVE
Glucose, UA: NEGATIVE
Ketones, UA: NEGATIVE
Leukocytes,UA: NEGATIVE
Nitrite, UA: NEGATIVE
Protein,UA: NEGATIVE
Specific Gravity, UA: >1.03 — ABNORMAL HIGH (ref 1.005–1.030)
Urobilinogen, Ur: 0.2 mg/dL (ref 0.2–1.0)
pH, UA: 5.5 (ref 5.0–7.5)

## 2020-07-06 LAB — MICROSCOPIC EXAMINATION

## 2020-07-07 LAB — CYTOLOGY - NON PAP

## 2020-07-10 ENCOUNTER — Telehealth: Payer: Self-pay

## 2020-07-10 NOTE — Telephone Encounter (Signed)
Incoming call from pt on triage line who states that she believes she has a UTI, she c/o itching, burning with urination and pressure. Advised pt that we would need to see her in clinic for UTI evaluation. Pt states that it is difficult for her to make it to our clinic as she works in [place] pt on the use of urgent care and/ PCP office for better access. Pt states she will need to look at her schedule and call back for appointment.

## 2020-10-03 ENCOUNTER — Ambulatory Visit: Payer: Self-pay | Admitting: Urology

## 2021-05-01 ENCOUNTER — Telehealth: Payer: Self-pay

## 2021-05-01 NOTE — Telephone Encounter (Signed)
Copied from [place] [id number]. Topic: General - Other >> May 01, 2021  3:59 PM Yvette Rack wrote: Reason for CRM: Pt called to request referral to [facility]. Advised pt that she would need to speak with her pcp for a referral. Pt stated Dr. Rosanna Randy is her pcp but it is not listed. Pt stated she also was seeing Dr. Caryn Section. Informed pt that she would be considered a new pt as the records show it has been more than 3 years since she was last seen. Pt disagreed and requests call back. Cb# [phone]

## 2021-05-03 NOTE — Telephone Encounter (Signed)
LMTCB 05/03/2021.  Last office visit here was with Mariel Sleet 07/31/2016.  She would like to establish care with another provider here.  PEC please advise pt when she calls back.   Thanks,   -Mickel Baas

## 2021-06-06 NOTE — Progress Notes (Signed)
06/07/2021 10:50 AM   Melinda Jenkins 10-25-1955 [id number]  Referring provider: Birdie Sons, MD [address] [place],  [place] [address]  Chief Complaint  Patient presents with   Hematuria   Urological history: 1. High risk hematuria -non-smoker -CTU 05/2020 - NED  -cysto 07/05/2020 - NED -urine cytology 06/2020- NEGATIVE FOR HIGH-GRADE UROTHELIAL CARCINOMA -no reports for gross heme -UA negative for micro heme  2. OAB -contributing factors of age, vaginal atrophy and IBS -managed with oxybutynin XL 15 mg daily   HPI: Melinda Jenkins is a 65 y.o. female who presents today for 3 month follow up.  She has been using the vaginal estrogen cream as a lubricant prior to having intercourse.    She has been having to strain to empty her bladder at times.  She also has to do this for BM's at time as well.  She admits she does not drink a lot of fluids.  This is going on for several months.  Patient denies any modifying or aggravating factors.  Patient denies any gross hematuria, dysuria or suprapubic/flank pain.  Patient denies any fevers, chills, nausea or vomiting.    UA benign.      PMH: Past Medical History:  Diagnosis Date   Acute antritis 05/03/2015   Chronic constipation 05/03/2015   CN (constipation) 01/20/2009   Enterogastritis 05/03/2015   GERD (gastroesophageal reflux disease)    Hemorrhoids    Found on Colonoscopy (03/06/15)   Internal hemorrhoids without complication 11/04/6061   Severe obstructive sleep apnea 05/03/2015    Surgical History: Past Surgical History:  Procedure Laterality Date   ABDOMINAL HYSTERECTOMY  1993   Partial- Dr. Randon Goldsmith   APPENDECTOMY  1993   Dr. Randon Goldsmith   BREAST SURGERY Left 1972   Cyst Removal- Benign   COLONOSCOPY WITH PROPOFOL N/A 03/06/2015   Procedure: COLONOSCOPY WITH PROPOFOL;  Surgeon: Hulen Luster, MD;  Location: [facility] ENDOSCOPY;  Service: Gastroenterology;  Laterality: N/A;   HEMORROIDECTOMY  03/31/2009   Dr.  Pat Patrick   OVARIAN CYST REMOVAL  1986   TONSILLECTOMY  as child    Home Medications:  Allergies as of 06/07/2021       Reactions   Codeine Itching   Meclizine Itching, Rash        Medication List        Accurate as of June 07, 2021 11:59 PM. If you have any questions, ask your nurse or doctor.          diazepam 10 MG tablet Commonly known as: Valium Please take one tablet 30 minutes prior to CT scan and one 30 minutes prior to cystoscopy.  You must have a driver.   linaclotide 290 MCG Caps capsule Commonly known as: LINZESS Take 290 mcg by mouth daily.   oxybutynin 15 MG 24 hr tablet Commonly known as: DITROPAN XL Take 1 tablet (15 mg total) by mouth daily.   pantoprazole 40 MG tablet Commonly known as: PROTONIX Take 40 mg by mouth daily.   Premarin vaginal cream Generic drug: conjugated estrogens Discard applicator Apply pea sized amount to tip of finger to urethra before bed. Wash hands well after application. Use Monday, Wednesday and Friday        Allergies:  Allergies  Allergen Reactions   Codeine Itching   Meclizine Itching and Rash    Family History: Family History  Problem Relation Age of Onset   Parkinson's disease Father    Colon polyps Father  Stroke Father    Heart disease Father    Colon polyps Mother    Cancer Brother    Hypertension Brother    Cancer Maternal Grandmother        Mouth   Heart disease Paternal Grandmother        Cardiomegaly   Parkinson's disease Paternal Grandfather    Kidney cancer Neg Hx    Bladder Cancer Neg Hx    Kidney disease Neg Hx     Social History:  reports that she has never smoked. She has never used smokeless tobacco. She reports that she does not drink alcohol and does not use drugs.  ROS: Pertinent ROS in HPI  Physical Exam: BP 111/78   Pulse 91   Ht 5' (1.524 m)   Wt 115 lb (52.2 kg)   BMI 22.46 kg/m   Constitutional:  Well nourished. Alert and oriented, No acute distress. HEENT:  [place] AT, mask in place.  Trachea midline Cardiovascular: No clubbing, cyanosis, or edema. Respiratory: Normal respiratory effort, no increased work of breathing. Neurologic: Grossly intact, no focal deficits, moving all 4 extremities. Psychiatric: Normal mood and affect.    Laboratory Data: WBC (White Blood Cell Count) 4.1 - 10.2 10^3/uL 8.7   RBC (Red Blood Cell Count) 4.04 - 5.48 10^6/uL 4.35   Hemoglobin 12.0 - 15.0 gm/dL 12.7   Hematocrit 35.0 - 47.0 % 39.6   MCV (Mean Corpuscular Volume) 80.0 - 100.0 fl 91.0   MCH (Mean Corpuscular Hemoglobin) 27.0 - 31.2 pg 29.2   MCHC (Mean Corpuscular Hemoglobin Concentration) 32.0 - 36.0 gm/dL 32.1   Platelet Count 150 - 450 10^3/uL 203   RDW-CV (Red Cell Distribution Width) 11.6 - 14.8 % 13.2   MPV (Mean Platelet Volume) 9.4 - 12.4 fl 10.9   Neutrophils 1.50 - 7.80 10^3/uL 4.84   Lymphocytes 1.00 - 3.60 10^3/uL 2.97   Monocytes 0.00 - 1.50 10^3/uL 0.49   Eosinophils 0.00 - 0.55 10^3/uL 0.34   Basophils 0.00 - 0.09 10^3/uL 0.04   Neutrophil % 32.0 - 70.0 % 55.7   Lymphocyte % 10.0 - 50.0 % 34.2   Monocyte % 4.0 - 13.0 % 5.6   Eosinophil % 1.0 - 5.0 % 3.9   Basophil% 0.0 - 2.0 % 0.5   Immature Granulocyte % <=0.7 % 0.1   Immature Granulocyte Count <=0.06 10^3/L 0.01   Resulting Agency  [facility]  Specimen Collected: 10/03/20 14:32 Last Resulted: 10/03/20 15:00  Received From: [facility]  Result Received: 10/17/20 14:33   Glucose 70 - 110 mg/dL 94   Sodium 136 - 145 mmol/L 143   Potassium 3.6 - 5.1 mmol/L 4.5   Chloride 97 - 109 mmol/L 107   Carbon Dioxide (CO2) 22.0 - 32.0 mmol/L 28.8   Urea Nitrogen (BUN) 7 - 25 mg/dL 14   Creatinine 0.6 - 1.1 mg/dL 0.8   Glomerular Filtration Rate (eGFR), MDRD Estimate >60 mL/min/1.73sq m 72   Calcium 8.7 - 10.3 mg/dL 9.6   AST  8 - 39 U/L 12   ALT  5 - 38 U/L 9   Alk Phos (alkaline Phosphatase) 34 - 104 U/L 77   Albumin 3.5 - 4.8 g/dL 4.1   Bilirubin, Total  0.3 - 1.2 mg/dL 0.4   Protein, Total 6.1 - 7.9 g/dL 6.2   A/G Ratio 1.0 - 5.0 gm/dL 2.0   Resulting Agency  [facility]  Specimen Collected: 10/03/20 14:32 Last Resulted: 10/03/20 17:49  Received From: [facility]  Result Received: 10/17/20 14:33    Urinalysis Component     Latest Ref Rng & Units 06/07/2021  Specific Gravity, UA     1.005 - 1.030 1.020  pH, UA     5.0 - 7.5 6.5  Color, UA     Yellow Yellow  Appearance Ur     Clear Hazy (A)  Leukocytes,UA     Negative 1+ (A)  Protein,UA     Negative/Trace Negative  Glucose, UA     Negative Negative  Ketones, UA     Negative Negative  RBC, UA     Negative Trace (A)  Bilirubin, UA     Negative Negative  Urobilinogen, Ur     0.2 - 1.0 mg/dL 0.2  Nitrite, UA     Negative Negative  Microscopic Examination      See below:   Component     Latest Ref Rng & Units 06/07/2021  WBC, UA     0 - 5 /hpf 6-10 (A)  RBC     0 - 2 /hpf 0-2  Epithelial Cells (non renal)     0 - 10 /hpf 0-10  Bacteria, UA     None seen/Few None seen  I have reviewed the labs.   Pertinent Imaging: N/A  Assessment & Plan:    1. High risk hematuria -work up 2021 - NED -no reports of gross heme -UA negative for micro  heme  2. OAB -Continue oxybutynin XL 15 mg daily-refills given -encouraged patient to increase water intake as her urine is likely concentrated causing bladder irritation -will call back if symptoms of incomplete emptying persist after increasing water intake   -explained that the vaginal estrogen cream also can help treat her symptoms of OAB  3.  Vaginal atrophy/urethral caruncle -explained that the vaginal estrogen cream is not indicated for lubrication and to not use it as such -explained that she needs to use it consistently to achieve the necessary physiological changes needed to treat her caruncle  -Continue vaginal estrogen cream 3 nights weekly  Return in about 1 year (around  06/07/2022) for UA, OAB questionnaire and PVR .  These notes generated with voice recognition software. I apologize for typographical errors.  Zara Council, PA-C  [facility] [address]  [place], [place] [address] [phone]

## 2021-06-07 ENCOUNTER — Encounter: Payer: Self-pay | Admitting: Urology

## 2021-06-07 ENCOUNTER — Ambulatory Visit: Payer: Medicare Other | Admitting: Urology

## 2021-06-07 ENCOUNTER — Other Ambulatory Visit: Payer: Self-pay

## 2021-06-07 VITALS — BP 111/78 | HR 91 | Ht 60.0 in | Wt 115.0 lb

## 2021-06-07 DIAGNOSIS — N362 Urethral caruncle: Secondary | ICD-10-CM

## 2021-06-07 DIAGNOSIS — R319 Hematuria, unspecified: Secondary | ICD-10-CM

## 2021-06-07 DIAGNOSIS — N3281 Overactive bladder: Secondary | ICD-10-CM

## 2021-06-07 LAB — URINALYSIS, COMPLETE
Bilirubin, UA: NEGATIVE
Glucose, UA: NEGATIVE
Ketones, UA: NEGATIVE
Nitrite, UA: NEGATIVE
Protein,UA: NEGATIVE
Specific Gravity, UA: 1.02 (ref 1.005–1.030)
Urobilinogen, Ur: 0.2 mg/dL (ref 0.2–1.0)
pH, UA: 6.5 (ref 5.0–7.5)

## 2021-06-07 LAB — MICROSCOPIC EXAMINATION: Bacteria, UA: NONE SEEN

## 2021-06-07 MED ORDER — PREMARIN 0.625 MG/GM VA CREA: TOPICAL_CREAM | VAGINAL | 12 refills | Status: DC

## 2021-06-07 MED ORDER — OXYBUTYNIN CHLORIDE ER 15 MG PO TB24: 15.0000 mg | ORAL_TABLET | Freq: Every day | ORAL | 3 refills | Status: DC

## 2021-08-07 NOTE — Progress Notes (Signed)
08/08/2021 11:41 AM   Jhade T Winer Oct 01, 1955 [id number]  Referring provider: No referring provider defined for this encounter.  Chief Complaint  Patient presents with   Dysuria   Hematuria   Over Active Bladder   Urological history: 1. High risk hematuria -non-smoker -CTU 05/2020 - NED  -cysto 07/05/2020 - NED -urine cytology 06/2020- NEGATIVE FOR HIGH-GRADE UROTHELIAL CARCINOMA -no reports for gross heme -UA negative for micro heme  2. OAB -contributing factors of age, vaginal atrophy and IBS -managed with oxybutynin XL 15 mg daily   HPI: Melinda Jenkins is a 66 y.o. female who presents today for symptoms of pressure and pain with urination.   UA bland  She has been having suprapubic pressure and dysuria for the last 4 to 5 days.  She states that she does not have any pain with urination but she has pain when she completes urination.  Patient denies any modifying or aggravating factors.  Patient denies any gross hematuria, dysuria or suprapubic/flank pain.  Patient denies any fevers, chills, nausea or vomiting.    She does not have a prior history of nephrolithiasis, she has had a hysterectomy but her right ovary remains.  PVR 54 mL   PMH: Past Medical History:  Diagnosis Date   Acute antritis 05/03/2015   Chronic constipation 05/03/2015   CN (constipation) 01/20/2009   Enterogastritis 05/03/2015   GERD (gastroesophageal reflux disease)    Hemorrhoids    Found on Colonoscopy (03/06/15)   Internal hemorrhoids without complication 7/85/8850   Severe obstructive sleep apnea 05/03/2015    Surgical History: Past Surgical History:  Procedure Laterality Date   ABDOMINAL HYSTERECTOMY  1993   Partial- Dr. Randon Goldsmith   APPENDECTOMY  1993   Dr. Randon Goldsmith   BREAST SURGERY Left 1972   Cyst Removal- Benign   COLONOSCOPY WITH PROPOFOL N/A 03/06/2015   Procedure: COLONOSCOPY WITH PROPOFOL;  Surgeon: Hulen Luster, MD;  Location: [facility] ENDOSCOPY;  Service: Gastroenterology;   Laterality: N/A;   HEMORROIDECTOMY  03/31/2009   Dr. Pat Patrick   OVARIAN CYST REMOVAL  1986   TONSILLECTOMY  as child    Home Medications:  Allergies as of 08/08/2021       Reactions   Codeine Itching   Meclizine Itching, Rash        Medication List        Accurate as of August 08, 2021 11:41 AM. If you have any questions, ask your nurse or doctor.          diazepam 10 MG tablet Commonly known as: Valium Please take one tablet 30 minutes prior to CT scan and one 30 minutes prior to cystoscopy.  You must have a driver.   linaclotide 290 MCG Caps capsule Commonly known as: LINZESS Take 290 mcg by mouth daily.   oxybutynin 15 MG 24 hr tablet Commonly known as: DITROPAN XL Take 1 tablet (15 mg total) by mouth daily.   pantoprazole 40 MG tablet Commonly known as: PROTONIX Take 40 mg by mouth daily.   Premarin vaginal cream Generic drug: conjugated estrogens Discard applicator Apply pea sized amount to tip of finger to urethra before bed. Wash hands well after application. Use Monday, Wednesday and Friday        Allergies:  Allergies  Allergen Reactions   Codeine Itching   Meclizine Itching and Rash    Family History: Family History  Problem Relation Age of Onset   Parkinson's disease Father    Colon polyps Father  Stroke Father    Heart disease Father    Colon polyps Mother    Cancer Brother    Hypertension Brother    Cancer Maternal Grandmother        Mouth   Heart disease Paternal Grandmother        Cardiomegaly   Parkinson's disease Paternal Grandfather    Kidney cancer Neg Hx    Bladder Cancer Neg Hx    Kidney disease Neg Hx     Social History:  reports that she has never smoked. She has never used smokeless tobacco. She reports that she does not drink alcohol and does not use drugs.  ROS: Pertinent ROS in HPI  Physical Exam: BP 117/69    Pulse 78    Ht 5' (1.524 m)    Wt 119 lb (54 kg)    BMI 23.24 kg/m   Constitutional:  Well  nourished. Alert and oriented, No acute distress. HEENT: [place] AT, mask in place.  Trachea midline Cardiovascular: No clubbing, cyanosis, or edema. Respiratory: Normal respiratory effort, no increased work of breathing. Neurologic: Grossly intact, no focal deficits, moving all 4 extremities. Psychiatric: Normal mood and affect.    Laboratory Data: Urinalysis Results for orders placed or performed in visit on 08/08/21  Microscopic Examination   Urine  Result Value Ref Range   WBC, UA 0-5 0 - 5 /hpf   RBC 0-2 0 - 2 /hpf   Epithelial Cells (non renal) 0-10 0 - 10 /hpf   Bacteria, UA None seen None seen/Few  Urinalysis, Complete  Result Value Ref Range   Specific Gravity, UA 1.015 1.005 - 1.030   pH, UA 5.5 5.0 - 7.5   Color, UA Yellow Yellow   Appearance Ur Hazy (A) Clear   Leukocytes,UA 1+ (A) Negative   Protein,UA Negative Negative/Trace   Glucose, UA Negative Negative   Ketones, UA Negative Negative   RBC, UA Negative Negative   Bilirubin, UA Negative Negative   Urobilinogen, Ur 0.2 0.2 - 1.0 mg/dL   Nitrite, UA Negative Negative   Microscopic Examination See below:   Bladder Scan (Post Void Residual) in office  Result Value Ref Range   Scan Result 7mL     I have reviewed the labs.   Pertinent Imaging: N/A  Assessment & Plan:    1. Suspected UTI -symptomatic  -UA bland -urine sent for culture -We will hold on prescribing antibiotics until culture is back -If urine culture is negative, we will obtain a pelvic ultrasound for further evaluation to see if her ovary is contributing to the symptoms -If urine culture is positive we will treat with culture appropriate antibiotics and reassess  2. High risk hematuria -work up 2021 - NED -no reports of gross heme -UA negative for micro  heme  3. OAB -Continue oxybutynin XL 15 mg daily  4.  Vaginal atrophy/urethral caruncle -explained that she needs to use it consistently to achieve the necessary physiological changes  needed to treat her caruncle   Return for pending urine cutlure results .  These notes generated with voice recognition software. I apologize for typographical errors.  Zara Council, PA-C  [facility] [address]  [place], [place] [address] [phone]

## 2021-08-08 ENCOUNTER — Other Ambulatory Visit: Payer: Self-pay

## 2021-08-08 ENCOUNTER — Ambulatory Visit: Payer: Medicare Other | Admitting: Urology

## 2021-08-08 ENCOUNTER — Encounter: Payer: Self-pay | Admitting: Urology

## 2021-08-08 VITALS — BP 117/69 | HR 78 | Ht 60.0 in | Wt 119.0 lb

## 2021-08-08 DIAGNOSIS — N952 Postmenopausal atrophic vaginitis: Secondary | ICD-10-CM

## 2021-08-08 DIAGNOSIS — N362 Urethral caruncle: Secondary | ICD-10-CM

## 2021-08-08 DIAGNOSIS — R319 Hematuria, unspecified: Secondary | ICD-10-CM

## 2021-08-08 DIAGNOSIS — N3281 Overactive bladder: Secondary | ICD-10-CM

## 2021-08-08 DIAGNOSIS — R3989 Other symptoms and signs involving the genitourinary system: Secondary | ICD-10-CM

## 2021-08-08 LAB — BLADDER SCAN AMB NON-IMAGING

## 2021-08-09 LAB — MICROSCOPIC EXAMINATION: Bacteria, UA: NONE SEEN

## 2021-08-09 LAB — URINALYSIS, COMPLETE
Bilirubin, UA: NEGATIVE
Glucose, UA: NEGATIVE
Ketones, UA: NEGATIVE
Nitrite, UA: NEGATIVE
Protein,UA: NEGATIVE
RBC, UA: NEGATIVE
Specific Gravity, UA: 1.015 (ref 1.005–1.030)
Urobilinogen, Ur: 0.2 mg/dL (ref 0.2–1.0)
pH, UA: 5.5 (ref 5.0–7.5)

## 2021-08-11 LAB — CULTURE, URINE COMPREHENSIVE

## 2021-08-12 ENCOUNTER — Other Ambulatory Visit: Payer: Self-pay | Admitting: Urology

## 2021-08-12 DIAGNOSIS — R102 Pelvic and perineal pain: Secondary | ICD-10-CM

## 2021-08-16 ENCOUNTER — Ambulatory Visit: Payer: Medicare Other

## 2021-08-30 ENCOUNTER — Ambulatory Visit
Admission: RE | Admit: 2021-08-30 | Discharge: 2021-08-30 | Disposition: A | Discharge status: Home / Self Care | Payer: Medicare Other | Source: Ambulatory Visit | Attending: Urology | Admitting: Urology

## 2021-08-30 ENCOUNTER — Other Ambulatory Visit: Payer: Self-pay

## 2021-08-30 DIAGNOSIS — R102 Pelvic and perineal pain: Secondary | ICD-10-CM | POA: Insufficient documentation

## 2022-06-11 ENCOUNTER — Ambulatory Visit: Payer: Medicare Other | Admitting: Urology

## 2022-06-12 ENCOUNTER — Ambulatory Visit: Payer: Medicare Other | Admitting: Urology

## 2022-06-17 ENCOUNTER — Other Ambulatory Visit
Admission: RE | Admit: 2022-06-17 | Discharge: 2022-06-17 | Disposition: A | Discharge status: Home / Self Care | Payer: Medicare Other | Source: Ambulatory Visit | Attending: Urology | Admitting: Urology

## 2022-06-17 ENCOUNTER — Ambulatory Visit: Payer: Medicare Other | Admitting: Urology

## 2022-06-17 ENCOUNTER — Encounter: Payer: Self-pay | Admitting: Urology

## 2022-06-17 VITALS — BP 123/79 | HR 70 | Ht 60.0 in | Wt 123.0 lb

## 2022-06-17 DIAGNOSIS — N3281 Overactive bladder: Secondary | ICD-10-CM

## 2022-06-17 DIAGNOSIS — N362 Urethral caruncle: Secondary | ICD-10-CM

## 2022-06-17 DIAGNOSIS — R300 Dysuria: Secondary | ICD-10-CM | POA: Insufficient documentation

## 2022-06-17 DIAGNOSIS — N941 Unspecified dyspareunia: Secondary | ICD-10-CM | POA: Diagnosis not present

## 2022-06-17 LAB — URINALYSIS, COMPLETE (UACMP) WITH MICROSCOPIC
Bilirubin Urine: NEGATIVE
Glucose, UA: NEGATIVE mg/dL
Hgb urine dipstick: NEGATIVE
Ketones, ur: NEGATIVE mg/dL
Nitrite: NEGATIVE
Protein, ur: NEGATIVE mg/dL
RBC / HPF: NONE SEEN RBC/hpf (ref 0–5)
Specific Gravity, Urine: 1.015 (ref 1.005–1.030)
pH: 5.5 (ref 5.0–8.0)

## 2022-06-17 LAB — BLADDER SCAN AMB NON-IMAGING

## 2022-06-17 IMAGING — CT CT ABD-PEL WO/W CM
2 of 6 series · 12 of 32 positions shown, 17 images · IV contrast (APPLIED)
Comparison: No priors.

CLINICAL DATA: 63-year-old female with history of gross hematuria.
Dark urine for the past 2-3 months. Right lower quadrant abdominal
and flank pain.

EXAM:
CT ABDOMEN AND PELVIS WITHOUT AND WITH CONTRAST
TECHNIQUE: Multidetector CT imaging of the abdomen and pelvis was performed
following the standard protocol before and following the bolus
administration of intravenous contrast.
CONTRAST:  125mL OMNIPAQUE IOHEXOL 300 MG/ML  SOLN

[Series 4: axial post · axial · 0.62mm/px · z∈[-959,-654]mm · 6 of 87 slices shown, 11 images]
[im 13/87  soft-tissue]
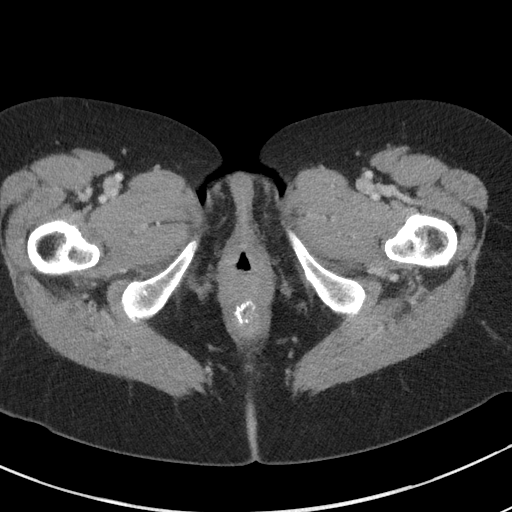
[im 13/87  bone]
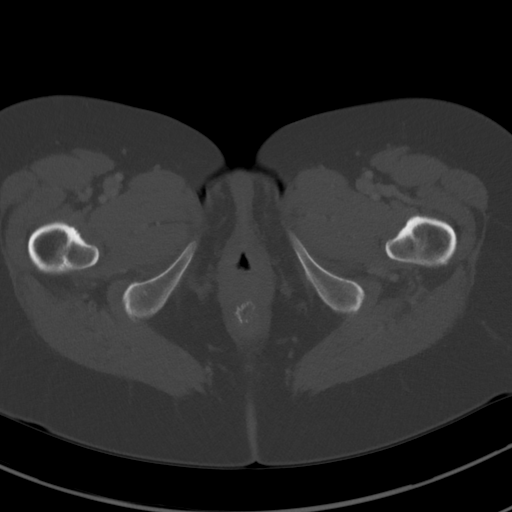
[im 25/87  soft-tissue]
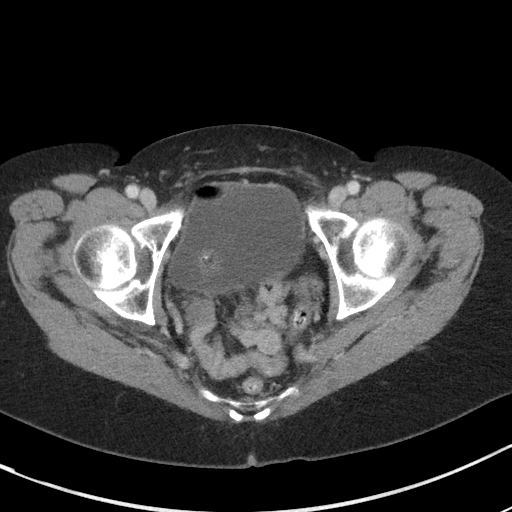
[im 37/87  soft-tissue]
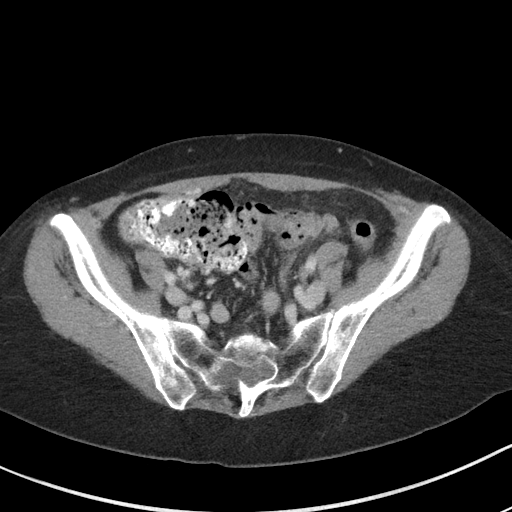
[im 37/87  lung]
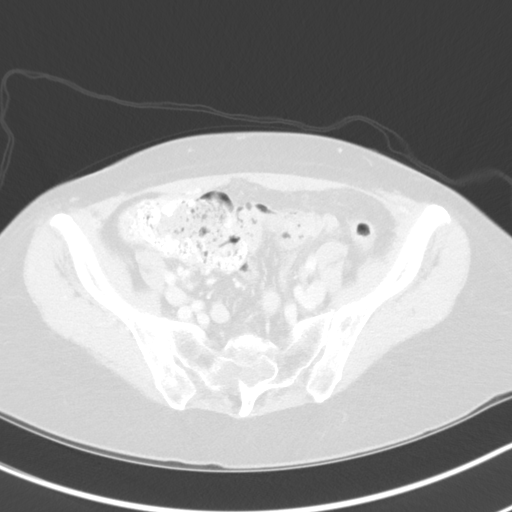
[im 50/87  soft-tissue]
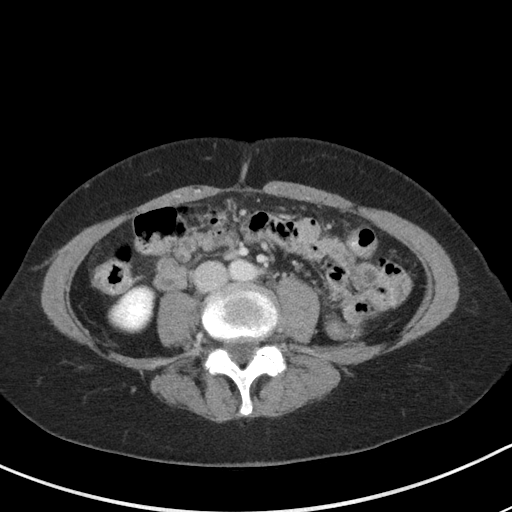
[im 50/87  lung]
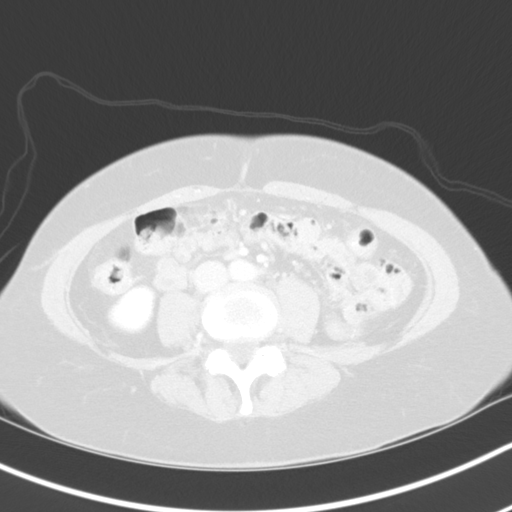
[im 62/87  soft-tissue]
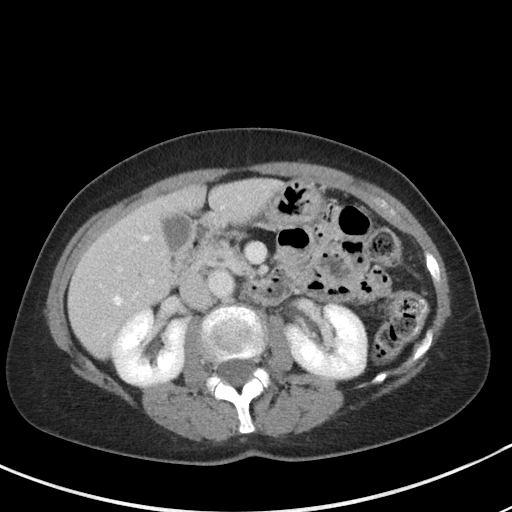
[im 62/87  lung]
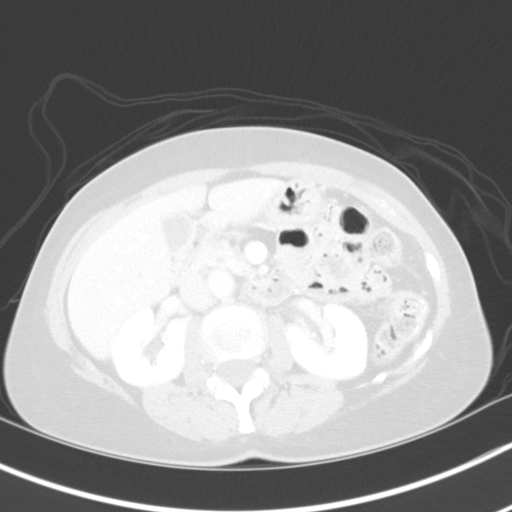
[im 74/87  soft-tissue]
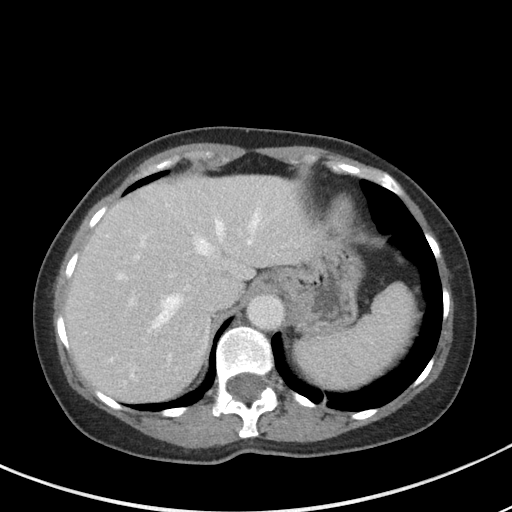
[im 74/87  lung]
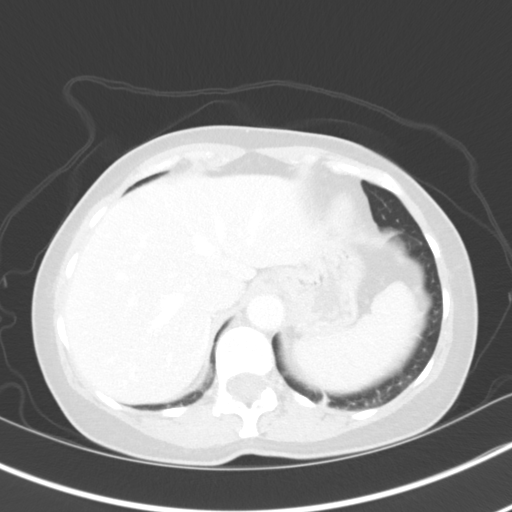

[Series 7: axial delay · axial · delayed · 0.66mm/px · z∈[-1068,-768]mm · 6 of 86 slices shown]
[im 13/86  soft-tissue]
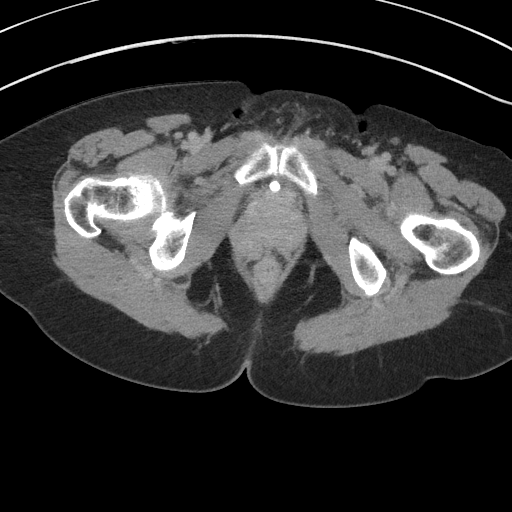
[im 25/86  soft-tissue]
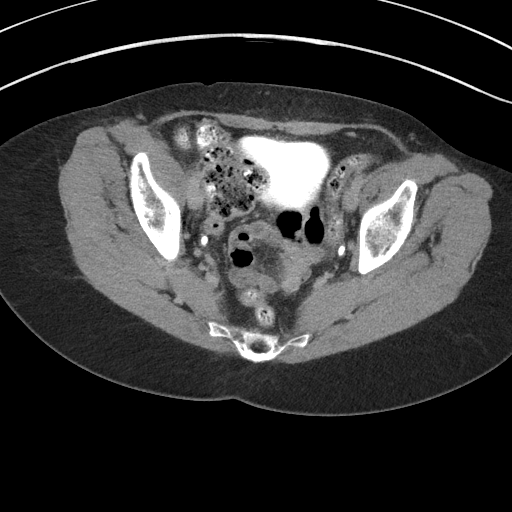
[im 37/86  soft-tissue]
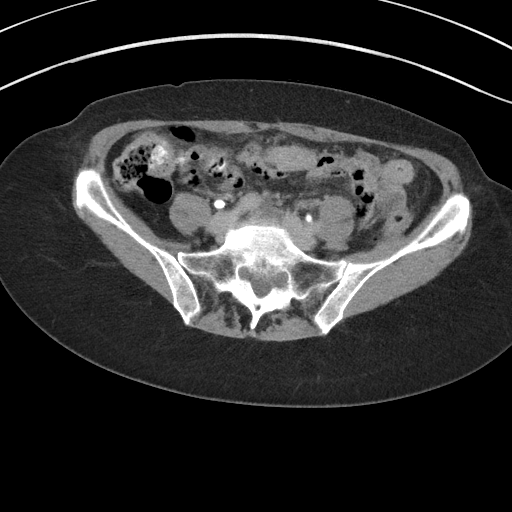
[im 49/86  soft-tissue]
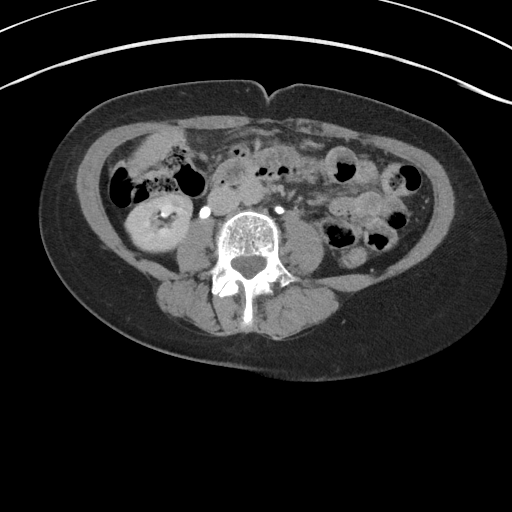
[im 61/86  soft-tissue]
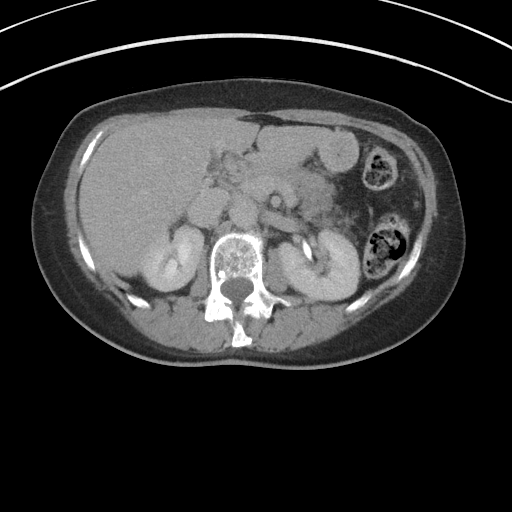
[im 73/86  soft-tissue]
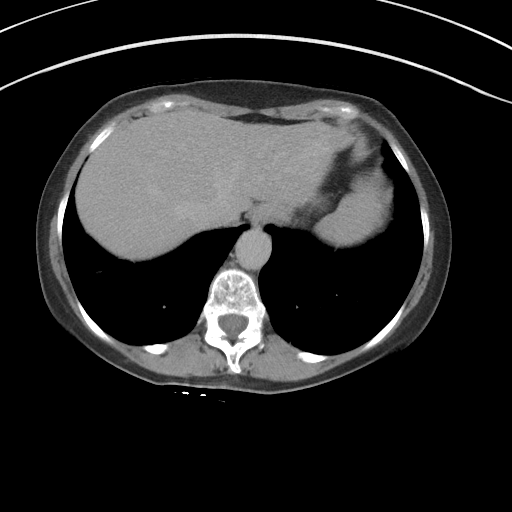

[12 of 32 positions shown; findings below may reference images not displayed]

FINDINGS: Lower chest: Mild scarring in the lower lobes of the lungs
bilaterally.

Hepatobiliary: No suspicious cystic or solid hepatic lesions. No
intra or extrahepatic biliary ductal dilatation. Gallbladder is
normal in appearance.

Pancreas: No pancreatic mass. No pancreatic ductal dilatation. No
pancreatic or peripancreatic fluid collections or inflammatory
changes.

Spleen: Unremarkable.

Adrenals/Urinary Tract: Precontrast images demonstrate no
calcifications within the collecting system of either kidney, along
the course of either ureter, or within the lumen of the urinary
bladder. No hydroureteronephrosis. Post contrast images demonstrate
no focal solid renal lesions. Post contrast delayed images
demonstrate no definite filling defects within the collecting system
of either kidney, along the course of either ureter, or within the
lumen of the urinary bladder to strongly suggest the presence of a
urothelial neoplasm. Urinary bladder is normal in appearance.
Bilateral adrenal glands are normal in appearance.

Stomach/Bowel: Normal appearance of the stomach. No pathologic
dilatation of small bowel or colon. The appendix is not confidently
identified and may be surgically absent. Regardless, there are no
inflammatory changes noted adjacent to the cecum to suggest the
presence of an acute appendicitis at this time.

Vascular/Lymphatic: No significant atherosclerotic disease, aneurysm
or dissection noted in the abdominal or pelvic vasculature. No
lymphadenopathy noted in the abdomen or pelvis.

Reproductive: Status post hysterectomy. Ovaries are not confidently
identified may be surgically absent or atrophic.

Other: No significant volume of ascites.  No pneumoperitoneum.

Musculoskeletal: There are no aggressive appearing lytic or blastic
lesions noted in the visualized portions of the skeleton.
IMPRESSION: 1. No source for hematuria identified on today's examination.
2. No cause of right lower quadrant or right-sided flank pain
identified on today's examination.
3. Incidental findings, as above.

## 2022-06-17 MED ORDER — OXYBUTYNIN CHLORIDE ER 15 MG PO TB24: 15.0000 mg | ORAL_TABLET | Freq: Every day | ORAL | 3 refills | Status: DC

## 2022-06-17 MED ORDER — ESTRADIOL 0.1 MG/GM VA CREA: TOPICAL_CREAM | VAGINAL | 12 refills | Status: DC

## 2022-06-17 NOTE — Progress Notes (Signed)
06/17/2022 4:18 PM   Melinda Jenkins 1956/03/23 [id number]  Referring provider: No referring provider defined for this encounter.  Chief Complaint  Patient presents with   Over Active Bladder   Dysuria   Urological history: 1. High risk hematuria -non-smoker -CTU 05/2020 - NED  -cysto 07/05/2020 - NED -urine cytology 06/2020- NEGATIVE FOR HIGH-GRADE UROTHELIAL CARCINOMA -no reports for gross heme -UA negative for micro heme  2. OAB -contributing factors of age, vaginal atrophy and IBS -oxybutynin XL 15 mg daily   HPI: Melinda Jenkins is a 66 y.o. female who presents today for yearly visit.   She has been having dysuria for several weeks.  She has a mild urge to urinate.  She is visiting the restroom 4 to 7 times daily and engages in toilet mapping.  She has some mild SUI and UI.    Patient denies any modifying or aggravating factors.  Patient denies any gross hematuria, dysuria or suprapubic/flank pain.  Patient denies any fevers, chills, nausea or vomiting.    UA yellow clear, SG 1.015, pH 5.5, small leukocytes, 0-5 squames, 0-5 WBC's and rare bacteria.    PVR 0 mL   She is having vaginal burning during intercourse and burning after the intercourse when she urinates.     PMH: Past Medical History:  Diagnosis Date   Acute antritis 05/03/2015   Chronic constipation 05/03/2015   CN (constipation) 01/20/2009   Enterogastritis 05/03/2015   GERD (gastroesophageal reflux disease)    Hemorrhoids    Found on Colonoscopy (03/06/15)   Internal hemorrhoids without complication 2/94/7654   Severe obstructive sleep apnea 05/03/2015    Surgical History: Past Surgical History:  Procedure Laterality Date   ABDOMINAL HYSTERECTOMY  1993   Partial- Dr. Randon Goldsmith   APPENDECTOMY  1993   Dr. Randon Goldsmith   BREAST SURGERY Left 1972   Cyst Removal- Benign   COLONOSCOPY WITH PROPOFOL N/A 03/06/2015   Procedure: COLONOSCOPY WITH PROPOFOL;  Surgeon: Hulen Luster, MD;  Location: [facility] ENDOSCOPY;   Service: Gastroenterology;  Laterality: N/A;   HEMORROIDECTOMY  03/31/2009   Dr. Pat Patrick   OVARIAN CYST REMOVAL  1986   TONSILLECTOMY  as child    Home Medications:  Allergies as of 06/17/2022       Reactions   Codeine Itching   Meclizine Itching, Rash        Medication List        Accurate as of June 17, 2022  4:18 PM. If you have any questions, ask your nurse or doctor.          STOP taking these medications    diazepam 10 MG tablet Commonly known as: Valium Stopped by: Zara Council, PA-C   Premarin vaginal cream Generic drug: conjugated estrogens Stopped by: Zara Council, PA-C       TAKE these medications    estradiol 0.1 MG/GM vaginal cream Commonly known as: ESTRACE Estrogen Cream Instruction Discard applicator Apply pea sized amount to tip of finger to urethra before bed. Wash hands well after application. Use Monday, Wednesday and Friday Started by: Zara Council, PA-C   linaclotide 290 MCG Caps capsule Commonly known as: LINZESS Take 290 mcg by mouth daily.   oxybutynin 15 MG 24 hr tablet Commonly known as: DITROPAN XL Take 1 tablet (15 mg total) by mouth daily.   pantoprazole 40 MG tablet Commonly known as: PROTONIX Take 40 mg by mouth daily.        Allergies:  Allergies  Allergen Reactions  Codeine Itching   Meclizine Itching and Rash    Family History: Family History  Problem Relation Age of Onset   Parkinson's disease Father    Colon polyps Father    Stroke Father    Heart disease Father    Colon polyps Mother    Cancer Brother    Hypertension Brother    Cancer Maternal Grandmother        Mouth   Heart disease Paternal Grandmother        Cardiomegaly   Parkinson's disease Paternal Grandfather    Kidney cancer Neg Hx    Bladder Cancer Neg Hx    Kidney disease Neg Hx     Social History:  reports that she has never smoked. She has never used smokeless tobacco. She reports that she does not drink alcohol and  does not use drugs.  ROS: Pertinent ROS in HPI  Physical Exam: BP 123/79 (BP Location: Left Arm, Patient Position: Sitting, Cuff Size: Normal)   Pulse 70   Ht 5' (1.524 m)   Wt 123 lb (55.8 kg)   BMI 24.02 kg/m   Constitutional:  Well nourished. Alert and oriented, No acute distress. HEENT: [place] AT, moist mucus membranes.  Trachea midline, no masses. Cardiovascular: No clubbing, cyanosis, or edema. Respiratory: Normal respiratory effort, no increased work of breathing. GU: No CVA tenderness.  No bladder fullness or masses. Vulvovaginal atrophy w/ pallor, loss of rugae, introital retraction, excoriations.  Vulvar thinning, fusion of labia, clitoral hood retraction, prominent urethral meatus.   No urethral masses, tenderness and/or prolapse.  No bladder fullness, tenderness or masses.  Fair pelvic support, grade II cystocele and no rectocele noted.  She was tender around the introitus.  Anus and perineum are without rashes or lesions.     Neurologic: Grossly intact, no focal deficits, moving all 4 extremities. Psychiatric: Normal mood and affect.    Laboratory Data: Serum creatinine (01/2022) 0.8  Urinalysis Component     Latest Ref Rng 06/17/2022  Glucose, UA     NEGATIVE mg/dL NEGATIVE   WBC, UA     0 - 5 WBC/hpf 0-5   Bacteria, UA     NONE SEEN  RARE !   Color, Urine     YELLOW  YELLOW   Appearance     CLEAR  CLEAR   Specific Gravity, Urine     1.005 - 1.030  1.015   pH     5.0 - 8.0  5.5   Hgb urine dipstick     NEGATIVE  NEGATIVE   Bilirubin Urine     NEGATIVE  NEGATIVE   Ketones, ur     NEGATIVE mg/dL NEGATIVE   Protein     NEGATIVE mg/dL NEGATIVE   Nitrite     NEGATIVE  NEGATIVE   Squamous Epithelial / LPF     0 - 5  0-5   Non Squamous Epithelial     NONE SEEN  PRESENT !   RBC / HPF     0 - 5 RBC/hpf NONE SEEN   Leukocytes,Ua     NEGATIVE  SMALL !     Legend: ! Abnormal I have reviewed the labs.  Pertinent Imaging: N/A  Assessment & Plan:    1.  High risk hematuria -work up 2021 - NED -no reports of gross heme -UA negative for micro  heme  2. OAB -Continue oxybutynin XL 15 mg daily  3.  Vaginal atrophy/urethral caruncle -dyspareunia likely the result of vaginal atrophy and  advised her to use lubricated gels and/or condoms in case the ejaculate fluid is irritating  -explained that she needs to use the vaginal cream consistently to achieve the necessary physiological changes needed to treat her caruncle   Return in about 1 year (around 06/18/2023) for PVR, UA and OAB questionnaire.  These notes generated with voice recognition software. I apologize for typographical errors.  [place], [place] [address]  [place], [place] [address] [phone]

## 2022-06-19 ENCOUNTER — Telehealth: Payer: Self-pay

## 2022-06-19 LAB — URINE CULTURE: Culture: 10000 — AB

## 2022-06-19 MED ORDER — SULFAMETHOXAZOLE-TRIMETHOPRIM 800-160 MG PO TABS: 1.0000 | ORAL_TABLET | Freq: Two times a day (BID) | ORAL | 0 refills | Status: DC

## 2022-06-19 NOTE — Telephone Encounter (Signed)
-----   Message from Nori Riis, PA-C sent at 06/19/2022  9:43 AM EST ----- Please let Mrs. Zahler know that her urine culture was positive and she needs to start Septra DS twice daily x seven days.

## 2022-06-19 NOTE — Telephone Encounter (Signed)
Ok per DPR, [facility] notifying pt of msg. Abx Rx sent to pts pharmacy.

## 2023-02-14 ENCOUNTER — Ambulatory Visit: Payer: Medicare Other | Admitting: Physician Assistant

## 2023-02-14 VITALS — BP 120/85 | HR 73 | Ht 59.0 in | Wt 119.0 lb

## 2023-02-14 DIAGNOSIS — N3281 Overactive bladder: Secondary | ICD-10-CM | POA: Diagnosis not present

## 2023-02-14 DIAGNOSIS — R3989 Other symptoms and signs involving the genitourinary system: Secondary | ICD-10-CM | POA: Diagnosis not present

## 2023-02-14 LAB — URINALYSIS, COMPLETE
Bilirubin, UA: NEGATIVE
Glucose, UA: NEGATIVE
Ketones, UA: NEGATIVE
Leukocytes,UA: NEGATIVE
Nitrite, UA: NEGATIVE
Protein,UA: NEGATIVE
RBC, UA: NEGATIVE
Specific Gravity, UA: 1.02 (ref 1.005–1.030)
Urobilinogen, Ur: 0.2 mg/dL (ref 0.2–1.0)
pH, UA: 6 (ref 5.0–7.5)

## 2023-02-14 LAB — MICROSCOPIC EXAMINATION
Epithelial Cells (non renal): >10 /hpf — AB (ref 0–10)
RBC, Urine: NONE SEEN /hpf (ref 0–2)

## 2023-02-14 MED ORDER — AMOXICILLIN-POT CLAVULANATE 875-125 MG PO TABS: 1.0000 | ORAL_TABLET | Freq: Two times a day (BID) | ORAL | 0 refills | Status: AC

## 2023-02-14 MED ORDER — MIRABEGRON ER 50 MG PO TB24: 50.0000 mg | ORAL_TABLET | Freq: Every day | ORAL | 11 refills | Status: DC

## 2023-02-14 NOTE — Progress Notes (Signed)
02/14/2023 2:02 PM   Melinda Jenkins 09/07/1955 [id number]  CC: Chief Complaint  Patient presents with   Urinary Frequency   HPI: Melinda Jenkins is a 67 y.o. female with PMH high risk hematuria with benign workup in 2021, OAB on oxybutynin XL 15 mg, GSM/urethral caruncle, and chronic constipation who presents today for evaluation of possible UTI.   Today she reports a 1 week history of bladder pain and cloudy urine without fever, chills, nausea, vomiting, or gross hematuria.  She took 1 dose of amoxicillin or Augmentin 3 days ago and her symptoms improved.  She also took 2 doses of Azo pain relief on Tuesday.  She continues to have chronic constipation and takes Linzess.  She has run out of oxybutynin and requests a refill.  In-office UA today pan negative; urine microscopy with >10 epithelial cells/hpf and many bacteria.   PMH: Past Medical History:  Diagnosis Date   Acute antritis 05/03/2015   Chronic constipation 05/03/2015   CN (constipation) 01/20/2009   Enterogastritis 05/03/2015   GERD (gastroesophageal reflux disease)    Hemorrhoids    Found on Colonoscopy (03/06/15)   Internal hemorrhoids without complication 03/14/2009   Severe obstructive sleep apnea 05/03/2015    Surgical History: Past Surgical History:  Procedure Laterality Date   ABDOMINAL HYSTERECTOMY  1993   Partial- Dr. Francoise Schaumann   APPENDECTOMY  1993   Dr. Francoise Schaumann   BREAST SURGERY Left 1972   Cyst Removal- Benign   COLONOSCOPY WITH PROPOFOL N/A 03/06/2015   Procedure: COLONOSCOPY WITH PROPOFOL;  Surgeon: Wallace Cullens, MD;  Location: [facility] ENDOSCOPY;  Service: Gastroenterology;  Laterality: N/A;   HEMORROIDECTOMY  03/31/2009   Dr. Michela Pitcher   OVARIAN CYST REMOVAL  1986   TONSILLECTOMY  as child    Home Medications:  Allergies as of 02/14/2023       Reactions   Codeine Itching   Meclizine Itching, Rash        Medication List        Accurate as of February 14, 2023  2:02 PM. If you have any questions, ask your  nurse or doctor.          STOP taking these medications    sulfamethoxazole-trimethoprim 800-160 MG tablet Commonly known as: BACTRIM DS Stopped by: Carman Ching       TAKE these medications    estradiol 0.1 MG/GM vaginal cream Commonly known as: ESTRACE Estrogen Cream Instruction Discard applicator Apply pea sized amount to tip of finger to urethra before bed. Wash hands well after application. Use Monday, Wednesday and Friday   linaclotide 290 MCG Caps capsule Commonly known as: LINZESS Take 290 mcg by mouth daily.   oxybutynin 15 MG 24 hr tablet Commonly known as: DITROPAN XL Take 1 tablet (15 mg total) by mouth daily.   pantoprazole 40 MG tablet Commonly known as: PROTONIX Take 40 mg by mouth daily.        Allergies:  Allergies  Allergen Reactions   Codeine Itching   Meclizine Itching and Rash    Family History: Family History  Problem Relation Age of Onset   Parkinson's disease Father    Colon polyps Father    Stroke Father    Heart disease Father    Colon polyps Mother    Cancer Brother    Hypertension Brother    Cancer Maternal Grandmother        Mouth   Heart disease Paternal Grandmother        Cardiomegaly  Parkinson's disease Paternal Grandfather    Kidney cancer Neg Hx    Bladder Cancer Neg Hx    Kidney disease Neg Hx     Social History:   reports that she has never smoked. She has never used smokeless tobacco. She reports that she does not drink alcohol and does not use drugs.  Physical Exam: BP 120/85   Pulse 73   Ht 4\' 11"  (1.499 m)   Wt 119 lb (54 kg)   BMI 24.04 kg/m   Constitutional:  Alert and oriented, no acute distress, nontoxic appearing HEENT: , AT Cardiovascular: No clubbing, cyanosis, or edema Respiratory: Normal respiratory effort, no increased work of breathing Skin: No rashes, bruises or suspicious lesions Neurologic: Grossly intact, no focal deficits, moving all 4 extremities Psychiatric: Normal  mood and affect  Laboratory Data: Results for orders placed or performed in visit on 02/14/23  Microscopic Examination   Urine  Result Value Ref Range   WBC, UA 0-5 0 - 5 /hpf   RBC, Urine None seen 0 - 2 /hpf   Epithelial Cells (non renal) >10 (A) 0 - 10 /hpf   Mucus, UA Present (A) Not Estab.   Bacteria, UA Many (A) None seen/Few  Urinalysis, Complete  Result Value Ref Range   Specific Gravity, UA 1.020 1.005 - 1.030   pH, UA 6.0 5.0 - 7.5   Color, UA Yellow Yellow   Appearance Ur Hazy (A) Clear   Leukocytes,UA Negative Negative   Protein,UA Negative Negative/Trace   Glucose, UA Negative Negative   Ketones, UA Negative Negative   RBC, UA Negative Negative   Bilirubin, UA Negative Negative   Urobilinogen, Ur 0.2 0.2 - 1.0 mg/dL   Nitrite, UA Negative Negative   Microscopic Examination See below:    Assessment & Plan:   1. Suspected UTI UA today is rather bland, though she did take a dose of antibiotic earlier this week.  Given symptoms, will treat with an appropriate course of Augmentin and send for culture, though I suspect this will be negative. - Urinalysis, Complete - amoxicillin-clavulanate (AUGMENTIN) 875-125 MG tablet; Take 1 tablet by mouth 2 (two) times daily for 5 days.  Dispense: 10 tablet; Refill: 0 - CULTURE, URINE COMPREHENSIVE  2. OAB (overactive bladder) We discussed switching to a beta agonist due to concerns that oxybutynin could be contributing to her chronic constipation and she agreed.  Will try Myrbetriq 50 mg, if cost prohibitive may do Gemtesa as an alternative. - mirabegron ER (MYRBETRIQ) 50 MG TB24 tablet; Take 1 tablet (50 mg total) by mouth daily.  Dispense: 30 tablet; Refill: 11   Return if symptoms worsen or fail to improve.  Carman Ching, PA-C  [facility] [place] [address], [address] [place], [place] [address] [phone]

## 2023-02-26 ENCOUNTER — Telehealth: Payer: Self-pay

## 2023-02-26 MED ORDER — FLUCONAZOLE 150 MG PO TABS: 150.0000 mg | ORAL_TABLET | Freq: Once | ORAL | 0 refills | Status: AC

## 2023-02-26 NOTE — Telephone Encounter (Signed)
Patient request something for yeast infection she got from the antibiotic she took for UTI.    Per Carollee Herter ok to send in diflucan.

## 2023-09-10 ENCOUNTER — Ambulatory Visit: Payer: Medicare Other | Admitting: Urology

## 2023-09-15 NOTE — Progress Notes (Signed)
 09/16/2023 7:38 PM   Jamela T Loredo Jun 03, 1956 [id number]  Referring provider: No referring provider defined for this encounter.  Urological history: 1. High risk hematuria -non-smoker -CTU 05/2020 - NED  -cysto 07/05/2020 - NED -urine cytology 06/2020- NEGATIVE FOR HIGH-GRADE UROTHELIAL CARCINOMA  2. OAB -contributing factors of age, vaginal atrophy and IBS -oxybutynin XL 15 mg daily   Chief Complaint  Patient presents with   Over Active Bladder    HPI: Melinda Jenkins is a 68 y.o. female who presents today for bladder leakage Issues, medication is not working   Previous records reviewed.     She has discontinued the Myrbetriq because it caused heart fluttering, hot flashes, constipation and it made her feel jittery.  She states she felt better on the oxybutynin.  She is having 1-7 daytime voids, 3 or more episodes of nocturia with a strong urge to urinate.  She is having urge incontinence.  She is leaking 3 or more times a week.  She is not wearing products for leakage.  She does engage in limiting her fluid intake.  And she does engage in toilet mapping.  She feels that her bladder may have dropped.  She states she is uncomfortable during sexual activity as she feels that her husband is hitting something during sexual activity.  She states her husband has also observed her bladder Foley out.  UA yellow clear, specific gravity greater than 1.030, pH 5.5, WBC 0-5, RBC 0-2, greater than 10 epithelial cells and a few bacteria.  PVR 0 mL  PMH: Past Medical History:  Diagnosis Date   Acute antritis 05/03/2015   Chronic constipation 05/03/2015   CN (constipation) 01/20/2009   Enterogastritis 05/03/2015   GERD (gastroesophageal reflux disease)    Hemorrhoids    Found on Colonoscopy (03/06/15)   Internal hemorrhoids without complication 03/14/2009   Severe obstructive sleep apnea 05/03/2015    Surgical History: Past Surgical History:  Procedure Laterality Date    ABDOMINAL HYSTERECTOMY  1993   Partial- Dr. Francoise Schaumann   APPENDECTOMY  1993   Dr. Francoise Schaumann   BREAST SURGERY Left 1972   Cyst Removal- Benign   COLONOSCOPY WITH PROPOFOL N/A 03/06/2015   Procedure: COLONOSCOPY WITH PROPOFOL;  Surgeon: Wallace Cullens, MD;  Location: [facility] ENDOSCOPY;  Service: Gastroenterology;  Laterality: N/A;   HEMORROIDECTOMY  03/31/2009   Dr. Michela Pitcher   OVARIAN CYST REMOVAL  1986   TONSILLECTOMY  as child    Home Medications:  Allergies as of 09/16/2023       Reactions   Codeine Itching   Meclizine Itching, Rash        Medication List        Accurate as of September 16, 2023 11:59 PM. If you have any questions, ask your nurse or doctor.          STOP taking these medications    mirabegron ER 50 MG Tb24 tablet Commonly known as: MYRBETRIQ       TAKE these medications    estradiol 0.1 MG/GM vaginal cream Commonly known as: ESTRACE Estrogen Cream Instruction Discard applicator Apply pea sized amount to tip of finger to urethra before bed. Wash hands well after application. Use Monday, Wednesday and Friday   linaclotide 290 MCG Caps capsule Commonly known as: LINZESS Take 290 mcg by mouth daily.   oxybutynin 15 MG 24 hr tablet Commonly known as: DITROPAN XL Take 1 tablet (15 mg total) by mouth daily.   pantoprazole 40 MG tablet Commonly known  as: PROTONIX Take 40 mg by mouth daily.        Allergies:  Allergies  Allergen Reactions   Codeine Itching   Meclizine Itching and Rash    Family History: Family History  Problem Relation Age of Onset   Parkinson's disease Father    Colon polyps Father    Stroke Father    Heart disease Father    Colon polyps Mother    Cancer Brother    Hypertension Brother    Cancer Maternal Grandmother        Mouth   Heart disease Paternal Grandmother        Cardiomegaly   Parkinson's disease Paternal Grandfather    Kidney cancer Neg Hx    Bladder Cancer Neg Hx    Kidney disease Neg Hx     Social History:   reports that she has never smoked. She has never used smokeless tobacco. She reports that she does not drink alcohol and does not use drugs.  ROS: Pertinent ROS in HPI  Physical Exam: BP 124/80   Pulse 78   Ht 5' (1.524 m)   Wt 124 lb (56.2 kg)   BMI 24.22 kg/m   Constitutional:  Well nourished. Alert and oriented, No acute distress. HEENT: [place] AT, moist mucus membranes.  Trachea midline Cardiovascular: No clubbing, cyanosis, or edema. Respiratory: Normal respiratory effort, no increased work of breathing. GU: No CVA tenderness.  No bladder fullness or masses.  Recession of labia minora, dry, pale vulvar vaginal mucosa and loss of mucosal ridges and folds.  Normal urethral meatus, no lesions, no prolapse, no discharge.   No urethral masses, tenderness and/or tenderness. No bladder fullness, tenderness or masses.  Pale vagina mucosa, poor estrogen effect, no discharge, no lesions, poor pelvic support, grade II cystocele and no rectocele noted.  Anus and perineum are without rashes or lesions.    Neurologic: Grossly intact, no focal deficits, moving all 4 extremities. Psychiatric: Normal mood and affect.    Laboratory Data: Urinalysis See EPIC and HPI I have reviewed the labs.  Pertinent Imaging:  09/16/23 15:57  Scan Result 0    Assessment & Plan:    1. High risk hematuria -work up 2021 - NED -no reports of gross heme -UA negative for micro  heme  2. OAB -Continue oxybutynin XL 15 mg daily  3.  Vaginal atrophy/urethral caruncle -continue vaginal estrogen cream three nights weekly  4. Cystocele -cystocele is becoming bothersome and is interferring with sexual activity -she is an employee of [employer] and would like a referral to an urogynecologist at [facility]   5.  Dyspareunia -see # 4   Return for refer to [facility] urogynecology .  These notes generated with voice recognition software. I apologize for typographical errors.  Cloretta Ned  [facility] [address]  [address] [place], [place] [address] [phone]

## 2023-09-16 ENCOUNTER — Ambulatory Visit: Payer: Medicare Other | Admitting: Urology

## 2023-09-16 VITALS — BP 124/80 | HR 78 | Ht 60.0 in | Wt 124.0 lb

## 2023-09-16 DIAGNOSIS — N3281 Overactive bladder: Secondary | ICD-10-CM

## 2023-09-16 DIAGNOSIS — N941 Unspecified dyspareunia: Secondary | ICD-10-CM

## 2023-09-16 DIAGNOSIS — N362 Urethral caruncle: Secondary | ICD-10-CM | POA: Diagnosis not present

## 2023-09-16 DIAGNOSIS — N952 Postmenopausal atrophic vaginitis: Secondary | ICD-10-CM | POA: Diagnosis not present

## 2023-09-16 DIAGNOSIS — N8111 Cystocele, midline: Secondary | ICD-10-CM | POA: Diagnosis not present

## 2023-09-16 LAB — URINALYSIS, COMPLETE
Bilirubin, UA: NEGATIVE
Glucose, UA: NEGATIVE
Ketones, UA: NEGATIVE
Leukocytes,UA: NEGATIVE
Nitrite, UA: NEGATIVE
Protein,UA: NEGATIVE
RBC, UA: NEGATIVE
Specific Gravity, UA: >1.03 — ABNORMAL HIGH (ref 1.005–1.030)
Urobilinogen, Ur: 0.2 mg/dL (ref 0.2–1.0)
pH, UA: 5.5 (ref 5.0–7.5)

## 2023-09-16 LAB — BLADDER SCAN AMB NON-IMAGING: Scan Result: 0

## 2023-09-16 LAB — MICROSCOPIC EXAMINATION: Epithelial Cells (non renal): >10 /HPF — AB (ref 0–10)

## 2023-09-16 MED ORDER — ESTRADIOL 0.1 MG/GM VA CREA: TOPICAL_CREAM | VAGINAL | 12 refills | Status: AC

## 2023-09-16 MED ORDER — OXYBUTYNIN CHLORIDE ER 15 MG PO TB24: 15.0000 mg | ORAL_TABLET | Freq: Every day | ORAL | 3 refills | Status: AC

## 2023-09-21 ENCOUNTER — Encounter: Payer: Self-pay | Admitting: Urology

## 2023-11-27 ENCOUNTER — Encounter: Payer: Self-pay | Admitting: Urology

## 2023-11-27 ENCOUNTER — Ambulatory Visit: Admitting: Urology

## 2023-11-27 VITALS — BP 137/79 | HR 76 | Ht 60.0 in | Wt 124.0 lb

## 2023-11-27 DIAGNOSIS — N952 Postmenopausal atrophic vaginitis: Secondary | ICD-10-CM | POA: Diagnosis not present

## 2023-11-27 DIAGNOSIS — N8111 Cystocele, midline: Secondary | ICD-10-CM | POA: Diagnosis not present

## 2023-11-27 DIAGNOSIS — R300 Dysuria: Secondary | ICD-10-CM | POA: Diagnosis not present

## 2023-11-27 DIAGNOSIS — N3281 Overactive bladder: Secondary | ICD-10-CM | POA: Diagnosis not present

## 2023-11-27 LAB — URINALYSIS, COMPLETE
Bilirubin, UA: NEGATIVE
Glucose, UA: NEGATIVE
Ketones, UA: NEGATIVE
Leukocytes,UA: NEGATIVE
Nitrite, UA: NEGATIVE
Protein,UA: NEGATIVE
RBC, UA: NEGATIVE
Specific Gravity, UA: 1.01 (ref 1.005–1.030)
Urobilinogen, Ur: 0.2 mg/dL (ref 0.2–1.0)
pH, UA: 6 (ref 5.0–7.5)

## 2023-11-27 LAB — MICROSCOPIC EXAMINATION: Epithelial Cells (non renal): >10 /HPF — AB (ref 0–10)

## 2023-11-27 MED ORDER — NITROFURANTOIN MONOHYD MACRO 100 MG PO CAPS: 100.0000 mg | ORAL_CAPSULE | Freq: Two times a day (BID) | ORAL | 0 refills | Status: AC

## 2023-11-27 NOTE — Progress Notes (Signed)
 11/27/2023 11:28 AM   Melinda Jenkins 1955-09-12 [id number]  Referring provider: No referring provider defined for this encounter.  Urological history: 1. High risk hematuria -non-smoker -CTU 05/2020 - NED  -cysto 07/05/2020 - NED -urine cytology 06/2020- NEGATIVE FOR HIGH-GRADE UROTHELIAL CARCINOMA  2. OAB -contributing factors of age, vaginal atrophy and IBS   Chief Complaint  Patient presents with   Follow-up   Over Active Bladder    HPI: Melinda Jenkins is a 68 y.o. female who presents today for possible UTI.    Previous records reviewed.     Since her last visit with me, she has been seen and evaluated by urogynecology and pelvic reconstructive surgery for her pelvic floor prolapse.  It has been advised that she undergo pelvic floor physical therapy and discontinue any OAB medications until this is completed.  For the last 2 days and one half, she has been experiencing dysuria and suprapubic pain.  Patient denies any modifying or aggravating factors.  She was catheterized at the urogynecology office for residual and she is wondering if this gave her an infection.  Patient denies any recent UTI's, gross hematuria or flank pain.  Patient denies any fevers, chills, nausea or vomiting.    Her UA is yellow clear, specific gravity 1.010, pH 6.0, 0-5 WBCs, 0-2 RBCs, greater than 10 epithelial cells and a few bacteria  PMH: Past Medical History:  Diagnosis Date   Acute antritis 05/03/2015   Chronic constipation 05/03/2015   CN (constipation) 01/20/2009   Enterogastritis 05/03/2015   GERD (gastroesophageal reflux disease)    Hemorrhoids    Found on Colonoscopy (03/06/15)   Internal hemorrhoids without complication 03/14/2009   Severe obstructive sleep apnea 05/03/2015    Surgical History: Past Surgical History:  Procedure Laterality Date   ABDOMINAL HYSTERECTOMY  1993   Partial- Dr. Buena Carmine   APPENDECTOMY  1993   Dr. Buena Carmine   BREAST SURGERY Left 1972   Cyst Removal-  Benign   COLONOSCOPY WITH PROPOFOL  N/A 03/06/2015   Procedure: COLONOSCOPY WITH PROPOFOL ;  Surgeon: Stephens Eis, MD;  Location: [facility] ENDOSCOPY;  Service: Gastroenterology;  Laterality: N/A;   HEMORROIDECTOMY  03/31/2009   Dr. Amalia Badder   OVARIAN CYST REMOVAL  1986   TONSILLECTOMY  as child    Home Medications:  Allergies as of 11/27/2023       Reactions   Codeine Itching   Oxycodone-acetaminophen Other (See Comments)   Meclizine Itching, Rash        Medication List        Accurate as of Nov 27, 2023 11:28 AM. If you have any questions, ask your nurse or doctor.          estradiol  0.1 MG/GM vaginal cream Commonly known as: ESTRACE  Estrogen Cream Instruction Discard applicator Apply pea sized amount to tip of finger to urethra before bed. Wash hands well after application. Use Monday, Wednesday and Friday   linaclotide 290 MCG Caps capsule Commonly known as: LINZESS Take 290 mcg by mouth daily.   nitrofurantoin (macrocrystal-monohydrate) 100 MG capsule Commonly known as: MACROBID Take 1 capsule (100 mg total) by mouth every 12 (twelve) hours.   oxybutynin  15 MG 24 hr tablet Commonly known as: DITROPAN  XL Take 1 tablet (15 mg total) by mouth daily.   pantoprazole 40 MG tablet Commonly known as: PROTONIX Take 40 mg by mouth daily.        Allergies:  Allergies  Allergen Reactions   Codeine Itching   Oxycodone-Acetaminophen Other (  See Comments)   Meclizine Itching and Rash    Family History: Family History  Problem Relation Age of Onset   Parkinson's disease Father    Colon polyps Father    Stroke Father    Heart disease Father    Colon polyps Mother    Cancer Brother    Hypertension Brother    Cancer Maternal Grandmother        Mouth   Heart disease Paternal Grandmother        Cardiomegaly   Parkinson's disease Paternal Grandfather    Kidney cancer Neg Hx    Bladder Cancer Neg Hx    Kidney disease Neg Hx     Social History:  reports that she has never  smoked. She has never used smokeless tobacco. She reports that she does not drink alcohol and does not use drugs.  ROS: Pertinent ROS in HPI  Physical Exam: BP 137/79   Pulse 76   Ht 5' (1.524 m)   Wt 124 lb (56.2 kg)   BMI 24.22 kg/m   Constitutional:  Well nourished. Alert and oriented, No acute distress. HEENT: [place] AT, moist mucus membranes.  Trachea midline Cardiovascular: No clubbing, cyanosis, or edema. Respiratory: Normal respiratory effort, no increased work of breathing. Neurologic: Grossly intact, no focal deficits, moving all 4 extremities. Psychiatric: Normal mood and affect.     Laboratory Data: Urinalysis See EPIC and HPI I have reviewed the labs.  Pertinent Imaging:  09/16/23 15:57  Scan Result 0    Assessment & Plan:    1. High risk hematuria -work up 2021 - NED -no reports of gross heme -UA negative for micro  heme  2. OAB - Advised to discontinue oxybutynin  by urogynecology due to her high tone pelvic issues, she is awaiting pelvic floor physical therapy  3.  Vaginal atrophy/urethral caruncle -continue vaginal estrogen cream three nights weekly  4. Cystocele - Has been seen and evaluated by [facility] urogynecology, they recommended pelvic floor physical therapy  5. Dysuria - Explained that her urinalysis is benign, but I will send it for culture - We discussed waiting until urine culture results are available to prescribe an antibiotic, but she wanted to start antibiotic 2-day as she has a very busy weekend with her grandchildren -I have sent in a prescription for Macrobid 100 mg twice daily empirically as Macrobid, will adjust if necessary once urine culture results are available -If urine culture is negative, I have recommended that we proceed with CT for further evaluation of her suprapubic pain  Return for Pending urine culture results.  These notes generated with voice recognition software. I apologize for typographical errors.  Briant Camper  [facility] [address]  [address] [place], [place] [address] [phone]

## 2023-12-03 ENCOUNTER — Ambulatory Visit: Payer: Self-pay | Admitting: Urology

## 2023-12-03 LAB — CULTURE, URINE COMPREHENSIVE

## 2024-02-06 ENCOUNTER — Ambulatory Visit: Payer: Self-pay

## 2024-02-06 DIAGNOSIS — Z09 Encounter for follow-up examination after completed treatment for conditions other than malignant neoplasm: Secondary | ICD-10-CM | POA: Diagnosis present

## 2024-02-06 DIAGNOSIS — D123 Benign neoplasm of transverse colon: Secondary | ICD-10-CM | POA: Diagnosis not present

## 2024-02-06 DIAGNOSIS — K640 First degree hemorrhoids: Secondary | ICD-10-CM | POA: Diagnosis not present

## 2024-02-06 DIAGNOSIS — D122 Benign neoplasm of ascending colon: Secondary | ICD-10-CM | POA: Diagnosis not present

## 2024-02-06 DIAGNOSIS — Z860100 Personal history of colon polyps, unspecified: Secondary | ICD-10-CM | POA: Diagnosis not present
